# Patient Record
Sex: Male | Born: 1960 | Race: White | Hispanic: No | Marital: Married | Smoking: Current every day smoker
Health system: Southern US, Community
[De-identification: ages and names within clinical notes are randomized; demographics above are authoritative.]

## PROBLEM LIST (undated history)

## (undated) DIAGNOSIS — E785 Hyperlipidemia, unspecified: Secondary | ICD-10-CM

## (undated) DIAGNOSIS — C61 Malignant neoplasm of prostate: Secondary | ICD-10-CM

## (undated) DIAGNOSIS — I10 Essential (primary) hypertension: Secondary | ICD-10-CM

## (undated) HISTORY — PX: PROSTATE BIOPSY: SHX241

---

## 1999-04-07 ENCOUNTER — Encounter: Payer: Self-pay | Admitting: Neurosurgery

## 1999-04-09 ENCOUNTER — Encounter: Payer: Self-pay | Admitting: Neurosurgery

## 1999-04-09 ENCOUNTER — Inpatient Hospital Stay (HOSPITAL_COMMUNITY): Admission: RE | Admit: 1999-04-09 | Discharge: 1999-04-11 | Payer: Self-pay | Admitting: Neurosurgery

## 2002-09-13 ENCOUNTER — Other Ambulatory Visit: Admission: RE | Admit: 2002-09-13 | Discharge: 2002-09-13 | Payer: Self-pay | Admitting: Urology

## 2003-02-18 ENCOUNTER — Ambulatory Visit (HOSPITAL_COMMUNITY): Admission: RE | Admit: 2003-02-18 | Discharge: 2003-02-18 | Payer: Self-pay | Admitting: Pulmonary Disease

## 2003-03-18 ENCOUNTER — Encounter: Admission: RE | Admit: 2003-03-18 | Discharge: 2003-03-18 | Payer: Self-pay | Admitting: Neurosurgery

## 2003-03-18 ENCOUNTER — Encounter: Payer: Self-pay | Admitting: Neurosurgery

## 2003-03-18 ENCOUNTER — Encounter: Payer: Self-pay | Admitting: Radiology

## 2003-04-04 ENCOUNTER — Encounter: Admission: RE | Admit: 2003-04-04 | Discharge: 2003-04-04 | Payer: Self-pay | Admitting: Neurosurgery

## 2003-04-04 ENCOUNTER — Encounter: Payer: Self-pay | Admitting: Neurosurgery

## 2008-04-16 ENCOUNTER — Observation Stay (HOSPITAL_COMMUNITY): Admission: EM | Admit: 2008-04-16 | Discharge: 2008-04-16 | Payer: Self-pay | Admitting: Emergency Medicine

## 2008-04-17 ENCOUNTER — Ambulatory Visit: Payer: Self-pay | Admitting: Cardiology

## 2008-04-17 ENCOUNTER — Encounter (HOSPITAL_COMMUNITY): Admission: RE | Admit: 2008-04-17 | Discharge: 2008-05-17 | Payer: Self-pay | Admitting: Cardiology

## 2008-09-27 HISTORY — PX: BACK SURGERY: SHX140

## 2011-02-09 NOTE — H&P (Signed)
[name], [name]                ACCOUNT NO.:  [id number]   MEDICAL RECORD NO.:  [id number]          PATIENT TYPE:  OBV   LOCATION:  IC05                          FACILITY:  [facility]   PHYSICIAN:  Kingsley Callander. Ouida Sills, MD       DATE OF BIRTH:  1961/05/21   DATE OF ADMISSION:  04/15/2008  DATE OF DISCHARGE:  07/21/2009LH                              HISTORY & PHYSICAL   CHIEF COMPLAINT:  Chest pain.   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  He described diaphoresis and slight shortness of  breath when the pain was at its peak intensity.  The pain was an 8/10  for about 30 minutes.  He tried Tums and standing and walking about, but  was unable to achieve relief.  There was no syncope.  There was no  nausea or vomiting.  He presented to the emergency room, where his  initial EKG and cardiac markers were negative.  He was treated with  nitroglycerin twice, but his pain had begun to subside prior to it.  He  has risk factors of hypertension and a half-pack per day smoking.  He  does not have diabetes.  His last cholesterol was in the normal range.   PAST MEDICAL HISTORY:  1. Hypertension.  2. Lumbar disk surgery.   MEDICATIONS:  1. Lisinopril 20 mg daily.  2. Aspirin 81 mg daily.  3. AndroGel daily.  4. Multivitamin daily.  5. Fish oil daily.   ALLERGIES:  None.   SOCIAL HISTORY:  He does not abuse alcohol.   FAMILY HISTORY:  No history of heart disease.   REVIEW OF SYSTEMS:  Noncontributory.   PHYSICAL EXAMINATION:  Pulse 64, respirations 20, temperature 98.1,  blood pressure 114/71.  GENERAL:  Alert and in no distress at the time of my exam.  HEENT:  Eyes normal.  Pharynx unremarkable.  NECK:  Supple with no JVD or thyromegaly.  LUNGS:  Clear.  HEART:  Regular with no murmurs.  CHEST:  Nontender.  ABDOMEN:  Nontender.  No hepatosplenomegaly.  EXTREMITIES:   No cyanosis, clubbing or edema.  NEURO:  Grossly intact.   LABORATORY DATA:  His troponin I is less than 0.01.  His EKG reveals  normal sinus rhythm.  White count 10.4, hemoglobin 16.6, platelets  216,000.  The comprehensive metabolic panel is normal.  Glucose is 105.  His chest x-ray reveals no infiltrate.  His D-dimer is normal.   IMPRESSION:  Left chest pain and diaphoresis.  He will be hospitalized  for observation and repeat cardiac enzymes and electrocardiograms.  Will  continue aspirin.  At this point he certainly shows no sign of a  definite ischemic event.  1. Hypertension.      Kingsley Callander. Ouida Sills, MD  Electronically Signed     ROF/MEDQ  D:  04/16/2008  T:  04/16/2008  Job:  [id number]

## 2011-02-09 NOTE — Discharge Summary (Signed)
[name], [name]                ACCOUNT NO.:  [id number]   MEDICAL RECORD NO.:  [id number]          PATIENT TYPE:  OBV   LOCATION:  IC05                          FACILITY:  [facility]   PHYSICIAN:  Kingsley Callander. Ouida Sills, MD       DATE OF BIRTH:  10-23-1960   DATE OF ADMISSION:  04/15/2008  DATE OF DISCHARGE:  07/21/2009LH                               DISCHARGE SUMMARY   DISCHARGE DIAGNOSES:  1. Chest pain.  2. Hypertension.   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  His  initial EKG revealed no sign of ischemia.  Three sets of cardiac markers  were negative in the emergency room.  He was hospitalized in a monitored  setting overnight.  He had no recurrent pain.  He remained in sinus  rhythm.  His repeat EKG was normal.  His repeat cardiac enzymes were  normal.  His D-dimer was normal.  His chest x-ray revealed no  infiltrate.  He was felt to be stable for discharge with followup stress  testing, tentatively scheduled for tomorrow.   DISCHARGE MEDICATIONS:  1. Lisinopril 20 mg daily.  2. Aspirin 81 mg daily.  3. AndroGel daily.  4. Multivitamin daily.  5. Fish oil daily.      Kingsley Callander. Ouida Sills, MD  Electronically Signed     ROF/MEDQ  D:  04/16/2008  T:  04/16/2008  Job:  [id number]

## 2011-06-25 LAB — COMPREHENSIVE METABOLIC PANEL
AST: 27
Albumin: 4.4
Calcium: 9.9
Chloride: 103
Creatinine, Ser: 1.05
GFR calc Af Amer: >60
Total Bilirubin: 0.6
Total Protein: 7

## 2011-06-25 LAB — CBC
MCV: 94
Platelets: 216
RDW: 13
WBC: 10.4

## 2011-06-25 LAB — POCT CARDIAC MARKERS
Myoglobin, poc: 79.3
Myoglobin, poc: 80.6
Operator id: 240821
Troponin i, poc: <0.05

## 2011-06-25 LAB — DIFFERENTIAL
Eosinophils Relative: 1
Lymphocytes Relative: 17
Lymphs Abs: 1.8
Monocytes Absolute: 0.8
Monocytes Relative: 8

## 2011-06-25 LAB — CARDIAC PANEL(CRET KIN+CKTOT+MB+TROPI)
CK, MB: 2.1
Total CK: 186
Troponin I: 0.01

## 2011-06-25 LAB — CK TOTAL AND CKMB (NOT AT ARMC)
CK, MB: 2.1
Relative Index: 1

## 2011-06-25 LAB — TROPONIN I: Troponin I: <0.01

## 2012-03-14 ENCOUNTER — Other Ambulatory Visit (HOSPITAL_COMMUNITY): Payer: Self-pay | Admitting: General Surgery

## 2012-03-14 ENCOUNTER — Ambulatory Visit (HOSPITAL_COMMUNITY)
Admission: RE | Admit: 2012-03-14 | Discharge: 2012-03-14 | Disposition: A | Discharge status: Home / Self Care | Payer: Self-pay | Source: Ambulatory Visit | Attending: General Surgery | Admitting: General Surgery

## 2012-03-14 DIAGNOSIS — I729 Aneurysm of unspecified site: Secondary | ICD-10-CM

## 2014-04-09 ENCOUNTER — Telehealth: Payer: Self-pay | Admitting: Endocrinology

## 2014-04-09 NOTE — Telephone Encounter (Signed)
Blood sugars have been going very high mid 200s in the AM, and 300s acS (6-7PM) for last 3-4 days.  No changes in diet, or illness, or no new medications.  Pt. Reports that Apidra vial shows no signes of going bad--no clouding, or has not been exposed to extreme temperatures.  He has since opened a new bottle of Lantus 2 days ago. He has increased his Lantus from 7AM/15u HS to 9AM and 17PM, and is adding 2-3 extra units of insulin to his meals.  He was using a correction of 1u/50 points, and now is doing 1u/ "30 or 40 points".  I/C ratio is 1u/5acb, 1u/8acL, and 1u/12acS.    Plan:   1. Increase Lantus to 10uAM, and 19uHS.  Increase meal insulin to B: 1u/5, L: 1u/6, and supper: 1u/10.  Increase Sensitivity to 30.   2.  Call Thursday if Blood sugars do no come down.

## 2014-04-09 NOTE — Telephone Encounter (Signed)
This is incorrect patient

## 2014-04-17 NOTE — Telephone Encounter (Signed)
This note was written on the wrong patient's chart.

## 2016-01-29 MED FILL — CIALIS 5 MG TABLET: 5 | 90 days supply | Qty: 90 | Fill #0

## 2016-01-29 MED FILL — LISINOPRIL 20 MG TABLET: 20 | 90 days supply | Qty: 90 | Fill #0

## 2016-01-30 MED FILL — ULTRAVATE 0.05% LOTION: 0.05 | 30 days supply | Qty: 60 | Fill #0

## 2016-05-05 MED FILL — CIALIS 5 MG TABLET: 5 | 90 days supply | Qty: 90 | Fill #1

## 2016-06-21 MED FILL — LISINOPRIL 20 MG TABLET: 20 | 90 days supply | Qty: 90 | Fill #1

## 2016-07-01 ENCOUNTER — Other Ambulatory Visit: Payer: Self-pay

## 2016-07-01 ENCOUNTER — Telehealth: Payer: Self-pay

## 2016-07-01 DIAGNOSIS — Z1211 Encounter for screening for malignant neoplasm of colon: Secondary | ICD-10-CM

## 2016-07-01 NOTE — Telephone Encounter (Signed)
Pt was aware when I triaged him that I will leave a sample of the prep and the instructions at the front for pick up.  I am leaving the Movie prep with instructions attached.

## 2016-07-01 NOTE — Telephone Encounter (Signed)
Okay to schedule with conscious sedation

## 2016-07-01 NOTE — Telephone Encounter (Signed)
Gastroenterology Pre-Procedure Review  Request Date: 07/01/2016 Requesting Physician: Dr. Gala Romney  PATIENT REVIEW QUESTIONS: The patient responded to the following health history questions as indicated:    1. Diabetes Melitis: no 2. Joint replacements in the past 12 months: no 3. Major health problems in the past 3 months: no 4. Has an artificial valve or MVP: no 5. Has a defibrillator: no 6. Has been advised in past to take antibiotics in advance of a procedure like teeth cleaning: no 7. Family history of colon cancer: no  8. Alcohol Use: Red wine socially 9. History of sleep apnea: no     MEDICATIONS & ALLERGIES:    Patient reports the following regarding taking any blood thinners:   Plavix? no Aspirin? YES Coumadin? no  Patient confirms/reports the following medications:  Current Outpatient Prescriptions  Medication Sig Dispense Refill  . aspirin EC 81 MG tablet Take 81 mg by mouth daily.    Marland Kitchen lisinopril (PRINIVIL,ZESTRIL) 20 MG tablet Take 20 mg by mouth daily.     No current facility-administered medications for this visit.     Patient confirms/reports the following allergies:  No Known Allergies  No orders of the defined types were placed in this encounter.   AUTHORIZATION INFORMATION Primary Insurance:   ID #:  Group #:  Pre-Cert / Auth required Pre-Cert / Auth #:   Secondary Insurance:  ID #: Group #:  Pre-Cert / Auth required: Pre-Cert / Auth #:   SCHEDULE INFORMATION: Procedure has been scheduled as follows:  Date: 07/23/2016             Time: 10:00 AM  Location: [facility] Short Stay  This Gastroenterology Pre-Precedure Review Form is being routed to the following provider(s): R. Garfield Cornea, MD

## 2016-07-16 MED FILL — ULTRAVATE 0.05% LOTION: 0.05 | 30 days supply | Qty: 60 | Fill #1

## 2016-07-20 ENCOUNTER — Telehealth: Payer: Self-pay

## 2016-07-20 NOTE — Telephone Encounter (Signed)
I called [facility] and spoke to Ty T who said that a PA is not required for a screening colonoscopy as out patient.  Reference # [id number].

## 2016-07-23 ENCOUNTER — Ambulatory Visit (HOSPITAL_COMMUNITY)
Admission: RE | Admit: 2016-07-23 | Discharge: 2016-07-23 | Disposition: A | Discharge status: Home / Self Care | Payer: 59 | Source: Ambulatory Visit | Attending: Internal Medicine | Admitting: Internal Medicine

## 2016-07-23 ENCOUNTER — Encounter (HOSPITAL_COMMUNITY): Payer: Self-pay

## 2016-07-23 ENCOUNTER — Encounter (HOSPITAL_COMMUNITY)
Admission: RE | Disposition: A | Discharge status: Home / Self Care | Payer: Self-pay | Source: Ambulatory Visit | Attending: Internal Medicine

## 2016-07-23 DIAGNOSIS — Z1212 Encounter for screening for malignant neoplasm of rectum: Secondary | ICD-10-CM | POA: Diagnosis not present

## 2016-07-23 DIAGNOSIS — Z7982 Long term (current) use of aspirin: Secondary | ICD-10-CM | POA: Diagnosis not present

## 2016-07-23 DIAGNOSIS — I10 Essential (primary) hypertension: Secondary | ICD-10-CM | POA: Diagnosis not present

## 2016-07-23 DIAGNOSIS — N529 Male erectile dysfunction, unspecified: Secondary | ICD-10-CM | POA: Insufficient documentation

## 2016-07-23 DIAGNOSIS — K5730 Diverticulosis of large intestine without perforation or abscess without bleeding: Secondary | ICD-10-CM | POA: Diagnosis not present

## 2016-07-23 DIAGNOSIS — Z1211 Encounter for screening for malignant neoplasm of colon: Secondary | ICD-10-CM | POA: Diagnosis not present

## 2016-07-23 HISTORY — PX: COLONOSCOPY: SHX5424

## 2016-07-23 HISTORY — DX: Essential (primary) hypertension: I10

## 2016-07-23 SURGERY — COLONOSCOPY
Anesthesia: Moderate Sedation

## 2016-07-23 MED ORDER — MIDAZOLAM HCL 5 MG/5ML IJ SOLN
INTRAMUSCULAR | Status: AC
  Filled 2016-07-23: qty 10

## 2016-07-23 MED ORDER — SODIUM CHLORIDE 0.9 % IV SOLN
INTRAVENOUS | Status: DC
  Administered 2016-07-23: 10:00:00 via INTRAVENOUS

## 2016-07-23 MED ORDER — ONDANSETRON HCL 4 MG/2ML IJ SOLN
INTRAMUSCULAR | Status: AC
  Filled 2016-07-23: qty 2

## 2016-07-23 MED ORDER — MIDAZOLAM HCL 5 MG/5ML IJ SOLN
INTRAMUSCULAR | Status: DC | PRN
  Administered 2016-07-23: 1 mg via INTRAVENOUS
  Administered 2016-07-23 (×2): 2 mg via INTRAVENOUS
  Administered 2016-07-23: 1 mg via INTRAVENOUS

## 2016-07-23 MED ORDER — ONDANSETRON HCL 4 MG/2ML IJ SOLN
INTRAMUSCULAR | Status: DC | PRN
  Administered 2016-07-23: 4 mg via INTRAVENOUS

## 2016-07-23 MED ORDER — MEPERIDINE HCL 100 MG/ML IJ SOLN
INTRAMUSCULAR | Status: AC
  Filled 2016-07-23: qty 2

## 2016-07-23 MED ORDER — MEPERIDINE HCL 100 MG/ML IJ SOLN
INTRAMUSCULAR | Status: DC | PRN
  Administered 2016-07-23: 50 mg via INTRAVENOUS
  Administered 2016-07-23: 25 mg via INTRAVENOUS
  Administered 2016-07-23: 50 mg via INTRAVENOUS
  Administered 2016-07-23: 25 mg via INTRAVENOUS

## 2016-07-23 MED ORDER — STERILE WATER FOR IRRIGATION IR SOLN
Status: DC | PRN
  Administered 2016-07-23: 10:00:00

## 2016-07-23 NOTE — H&P (Signed)
@  [id number]   Primary Care Physician:  Alonza Bogus, MD Primary Gastroenterologist:  Dr. Gala Romney  Pre-Procedure History & Physical: HPI:  [name] is a 55 y.o. male is here for a screening colonoscopy. No bowel symptoms. No family history of colon cancer. No prior colonoscopy.  Past Medical History:  Diagnosis Date  . Hypertension     Past Surgical History:  Procedure Laterality Date  . BACK SURGERY  2010    Prior to Admission medications   Medication Sig Start Date End Date Taking? Authorizing Provider  aspirin EC 81 MG tablet Take 81 mg by mouth daily.   Yes Historical Provider, MD  lisinopril (PRINIVIL,ZESTRIL) 20 MG tablet Take 20 mg by mouth daily.   Yes Historical Provider, MD  Multiple Vitamin (MULTIVITAMIN WITH MINERALS) TABS tablet Take 1 tablet by mouth daily.   Yes Historical Provider, MD  tadalafil (CIALIS) 5 MG tablet Take 5 mg by mouth daily as needed for erectile dysfunction.   Yes Historical Provider, MD    Allergies as of 07/01/2016  . (No Known Allergies)    History reviewed. No pertinent family history.  Social History   Social History  . Marital status: Married    Spouse name: N/A  . Number of children: N/A  . Years of education: N/A   Occupational History  . Not on file.   Social History Main Topics  . Smoking status: Never Smoker  . Smokeless tobacco: Never Used  . Alcohol use Yes  . Drug use: No  . Sexual activity: Not on file   Other Topics Concern  . Not on file   Social History Narrative  . No narrative on file    Review of Systems: See HPI, otherwise negative ROS  Physical Exam: BP 120/79   Pulse 68   Temp 97.6 F (36.4 C) (Oral)   Resp 15   Ht 6' (1.829 m)   Wt 175 lb (79.4 kg)   SpO2 98%   BMI 23.73 kg/m  General:   Alert,  Well-developed, well-nourished, pleasant and cooperative in NAD Head:  Normocephalic and atraumatic. Lungs:  Clear throughout to auscultation.   No wheezes, crackles, or rhonchi. No acute  distress. Heart:  Regular rate and rhythm; no murmurs, clicks, rubs,  or gallops. Abdomen:  Soft, nontender and nondistended. No masses, hepatosplenomegaly or hernias noted. Normal bowel sounds, without guarding, and without rebound.    Impression/Plan: [name] is now here to undergo a screening colonoscopy.  First ever aperture screening examination.  Risks, benefits, limitations, imponderables and alternatives regarding colonoscopy have been reviewed with the patient. Questions have been answered. All parties agreeable.     Notice:  This dictation was prepared with Dragon dictation along with smaller phrase technology. Any transcriptional errors that result from this process are unintentional and may not be corrected upon review.

## 2016-07-23 NOTE — Discharge Instructions (Addendum)
°Colonoscopy °Discharge Instructions ° °Read the instructions outlined below and refer to this sheet in the next few weeks. These discharge instructions provide you with general information on caring for yourself after you leave the hospital. Your doctor may also give you specific instructions. While your treatment has been planned according to the most current medical practices available, unavoidable complications occasionally occur. If you have any problems or questions after discharge, call Dr. Rourk at [phone]. °ACTIVITY °· You may resume your regular activity, but move at a slower pace for the next 24 hours.  °· Take frequent rest periods for the next 24 hours.  °· Walking will help get rid of the air and reduce the bloated feeling in your belly (abdomen).  °· No driving for 24 hours (because of the medicine (anesthesia) used during the test).   °· Do not sign any important legal documents or operate any machinery for 24 hours (because of the anesthesia used during the test).  °NUTRITION °· Drink plenty of fluids.  °· You may resume your normal diet as instructed by your doctor.  °· Begin with a light meal and progress to your normal diet. Heavy or fried foods are harder to digest and may make you feel sick to your stomach (nauseated).  °· Avoid alcoholic beverages for 24 hours or as instructed.  °MEDICATIONS °· You may resume your normal medications unless your doctor tells you otherwise.  °WHAT YOU CAN EXPECT TODAY °· Some feelings of bloating in the abdomen.  °· Passage of more gas than usual.  °· Spotting of blood in your stool or on the toilet paper.  °IF YOU HAD POLYPS REMOVED DURING THE COLONOSCOPY: °· No aspirin products for 7 days or as instructed.  °· No alcohol for 7 days or as instructed.  °· Eat a soft diet for the next 24 hours.  °FINDING OUT THE RESULTS OF YOUR TEST °Not all test results are available during your visit. If your test results are not back during the visit, make an appointment  with your caregiver to find out the results. Do not assume everything is normal if you have not heard from your caregiver or the medical facility. It is important for you to follow up on all of your test results.  °SEEK IMMEDIATE MEDICAL ATTENTION IF: °· You have more than a spotting of blood in your stool.  °· Your belly is swollen (abdominal distention).  °· You are nauseated or vomiting.  °· You have a temperature over 101.  °· You have abdominal pain or discomfort that is severe or gets worse throughout the day.  ° ° ° °Diverticulosis information provided ° °Further recommendations to follow pending review of pathology report ° ° °Diverticulosis °Diverticulosis is the condition that develops when small pouches (diverticula) form in the wall of your colon. Your colon, or large intestine, is where water is absorbed and stool is formed. The pouches form when the inside layer of your colon pushes through weak spots in the outer layers of your colon. °CAUSES  °No one knows exactly what causes diverticulosis. °RISK FACTORS °Being older than 50. Your risk for this condition increases with age. Diverticulosis is rare in people younger than 40 years. By age 80, almost everyone has it. °Eating a low-fiber diet. °Being frequently constipated. °Being overweight. °Not getting enough exercise. °Smoking. °Taking over-the-counter pain medicines, like aspirin and ibuprofen. °SYMPTOMS  °Most people with diverticulosis do not have symptoms. °DIAGNOSIS  °Because diverticulosis often has no symptoms, health care   providers often discover the condition during an exam for other colon problems. In many cases, a health care provider will diagnose diverticulosis while using a flexible scope to examine the colon (colonoscopy). °TREATMENT  °If you have never developed an infection related to diverticulosis, you may not need treatment. If you have had an infection before, treatment may include: °Eating more fruits, vegetables, and  grains. °Taking a fiber supplement. °Taking a live bacteria supplement (probiotic). °Taking medicine to relax your colon. °HOME CARE INSTRUCTIONS  °Drink at least 6-8 glasses of water each day to prevent constipation. °Try not to strain when you have a bowel movement. °Keep all follow-up appointments. °If you have had an infection before:  °Increase the fiber in your diet as directed by your health care provider or dietitian. °Take a dietary fiber supplement if your health care provider approves. °Only take medicines as directed by your health care provider. °SEEK MEDICAL CARE IF:  °You have abdominal pain. °You have bloating. °You have cramps. °You have not gone to the bathroom in 3 days. °SEEK IMMEDIATE MEDICAL CARE IF:  °Your pain gets worse. °Your bloating becomes very bad. °You have a fever or chills, and your symptoms suddenly get worse. °You begin vomiting. °You have bowel movements that are bloody or black. °MAKE SURE YOU: °Understand these instructions. °Will watch your condition. °Will get help right away if you are not doing well or get worse. °  °This information is not intended to replace advice given to you by your health care provider. Make sure you discuss any questions you have with your health care provider. °  °Document Released: 06/10/2004 Document Revised: 09/18/2013 Document Reviewed: 08/08/2013 °Elsevier Interactive Patient Education ©2016 [employer] ° °

## 2016-07-23 NOTE — Op Note (Signed)
[facility] Patient Name: [name] Procedure Date: 07/23/2016 9:42 AM MRN: [id number] Date of Birth: [birth year] Attending MD: Norvel Richards , MD CSN: [id number] Age: 55 Admit Type: Outpatient Procedure:                Colonoscopy with biopsy Indications:              Screening for colorectal malignant neoplasm Providers:                Norvel Richards, MD, Renda Rolls, RN, Isabella Stalling, Technician Referring MD:              Medicines:                Midazolam 6 mg IV, Meperidine 150 mg IV,                            Ondansetron 4 mg IV Complications:            No immediate complications. Estimated Blood Loss:     Estimated blood loss was minimal. Procedure:                Pre-Anesthesia Assessment:                           - Prior to the procedure, a History and Physical                            was performed, and patient medications and                            allergies were reviewed. The patient's tolerance of                            previous anesthesia was also reviewed. The risks                            and benefits of the procedure and the sedation                            options and risks were discussed with the patient.                            All questions were answered, and informed consent                            was obtained. Prior Anticoagulants: The patient has                            taken no previous anticoagulant or antiplatelet                            agents. ASA Grade Assessment: II - A patient with  mild systemic disease. After reviewing the risks                            and benefits, the patient was deemed in                            satisfactory condition to undergo the procedure.                           After obtaining informed consent, the colonoscope                            was passed under direct vision. Throughout the   procedure, the patient's blood pressure, pulse, and                            oxygen saturations were monitored continuously. The                            EC-[id number] scope was introduced through                            the anus and advanced to the the cecum, identified                            by appendiceal orifice and ileocecal valve. The                            entire colon was well visualized. The ileocecal                            valve, appendiceal orifice, and rectum were                            photographed. The quality of the bowel preparation                            was adequate. Scope In: 9:56:23 AM Scope Out: 10:25:03 AM Scope Withdrawal Time: 0 hours 22 minutes 52 seconds  Total Procedure Duration: 0 hours 28 minutes 40 seconds  Findings:      The perianal and digital rectal examinations were normal.      Scattered small-mouthed diverticula were found in the sigmoid colon and       descending colon. Couple of tiny 1-2 mm minimal mamillaions scattered in       the descending segment (one of these was biopsied). Otherwise, the       colonic mucosa appeared normal. Estimated blood loss was minimal. Impression:               - Diverticulosis in the sigmoid colon and in the                            descending colon.                           -  colon biopsy as descried above Moderate Sedation:      Moderate (conscious) sedation was administered by the endoscopy nurse       and supervised by the endoscopist. The following parameters were       monitored: oxygen saturation, heart rate, blood pressure, respiratory       rate, EKG, adequacy of pulmonary ventilation, and response to care.       Total physician intraservice time was 44 minutes.      Moderate (conscious) sedation was administered by the endoscopy nurse       and supervised by the endoscopist. The following parameters were       monitored: oxygen saturation, heart rate, blood pressure,  respiratory       rate, EKG, adequacy of pulmonary ventilation, and response to care.       Total physician intraservice time was 38 minutes. Recommendation:           - Patient has a contact number available for                            emergencies. The signs and symptoms of potential                            delayed complications were discussed with the                            patient. Return to normal activities tomorrow.                            Written discharge instructions were provided to the                            patient.                           - Resume previous diet.                           - Continue present medications.                           - Repeat colonoscopy in 10 years for screening                            purposes. Follow-up on pathology                           - Return to GI office PRN. Procedure Code(s):        --- Professional ---                           [phone], Colonoscopy, flexible; diagnostic, including                            collection of specimen(s) by brushing or washing,                            when performed (separate procedure)  [id number], Moderate sedation services provided by the                            same physician or other qualified health care                            professional performing the diagnostic or                            therapeutic service that the sedation supports,                            requiring the presence of an independent trained                            observer to assist in the monitoring of the                            patient's level of consciousness and physiological                            status; initial 15 minutes of intraservice time,                            patient age 35 years or older                           [id number], Moderate sedation services provided by the                            same physician or other qualified health care                             professional performing the diagnostic or                            therapeutic service that the sedation supports,                            requiring the presence of an independent trained                            observer to assist in the monitoring of the                            patient's level of consciousness and physiological                            status; initial 15 minutes of intraservice time,                            patient age 23 years or older  [id number], Moderate sedation services; each additional                            15 minutes intraservice time                           [id number], Moderate sedation services; each additional                            15 minutes intraservice time                           [id number], Moderate sedation services; each additional                            15 minutes intraservice time                           [id number], Moderate sedation services; each additional                            15 minutes intraservice time Diagnosis Code(s):        --- Professional ---                           Z12.11, Encounter for screening for malignant                            neoplasm of colon                           K57.30, Diverticulosis of large intestine without                            perforation or abscess without bleeding CPT copyright 2016 [employer]. All rights reserved. The codes documented in this report are preliminary and upon coder review may  be revised to meet current compliance requirements. Cristopher Estimable. Jeiry Birnbaum, MD Norvel Richards, MD 07/23/2016 10:40:40 AM This report has been signed electronically. Number of Addenda: 0

## 2016-07-26 ENCOUNTER — Encounter: Payer: Self-pay | Admitting: Internal Medicine

## 2016-07-27 ENCOUNTER — Encounter (HOSPITAL_COMMUNITY): Payer: Self-pay | Admitting: Internal Medicine

## 2016-08-05 DIAGNOSIS — H52223 Regular astigmatism, bilateral: Secondary | ICD-10-CM | POA: Diagnosis not present

## 2016-08-05 DIAGNOSIS — H524 Presbyopia: Secondary | ICD-10-CM | POA: Diagnosis not present

## 2016-08-05 DIAGNOSIS — H5201 Hypermetropia, right eye: Secondary | ICD-10-CM | POA: Diagnosis not present

## 2016-08-05 DIAGNOSIS — D3131 Benign neoplasm of right choroid: Secondary | ICD-10-CM | POA: Diagnosis not present

## 2016-08-12 DIAGNOSIS — Z0000 Encounter for general adult medical examination without abnormal findings: Secondary | ICD-10-CM | POA: Diagnosis not present

## 2016-08-12 MED FILL — LISINOPRIL-HCTZ 20-12.5 MG: 20-12.5 | 90 days supply | Qty: 90 | Fill #0

## 2016-10-07 MED FILL — CIALIS 5 MG TABLET: 5 | 30 days supply | Qty: 30 | Fill #2

## 2016-11-08 MED FILL — LISINOPRIL-HCTZ 20-12.5 MG: 20-12.5 | 90 days supply | Qty: 90 | Fill #1

## 2017-02-02 MED FILL — LISINOPRIL-HCTZ 20-12.5 MG: 20-12.5 | 90 days supply | Qty: 90 | Fill #2

## 2017-03-22 MED FILL — AMOXICILLIN 500 MG CAPSULE: 500 | 5 days supply | Qty: 15 | Fill #0

## 2017-03-29 MED FILL — AMOXICILLIN 500 MG CAPSULE: 500 | 5 days supply | Qty: 15 | Fill #0

## 2017-05-09 MED FILL — LISINOPRIL-HCTZ 20-12.5 MG: 20-12.5 | 90 days supply | Qty: 90 | Fill #3

## 2017-07-13 ENCOUNTER — Encounter (HOSPITAL_COMMUNITY): Payer: Self-pay | Admitting: Emergency Medicine

## 2017-07-13 ENCOUNTER — Emergency Department (HOSPITAL_COMMUNITY)
Admission: EM | Admit: 2017-07-13 | Discharge: 2017-07-13 | Disposition: A | Discharge status: Home / Self Care | Payer: PRIVATE HEALTH INSURANCE | Attending: Emergency Medicine | Admitting: Emergency Medicine

## 2017-07-13 DIAGNOSIS — Z79899 Other long term (current) drug therapy: Secondary | ICD-10-CM | POA: Insufficient documentation

## 2017-07-13 DIAGNOSIS — Z7982 Long term (current) use of aspirin: Secondary | ICD-10-CM | POA: Diagnosis not present

## 2017-07-13 DIAGNOSIS — I10 Essential (primary) hypertension: Secondary | ICD-10-CM | POA: Diagnosis not present

## 2017-07-13 DIAGNOSIS — T592X1A Toxic effect of formaldehyde, accidental (unintentional), initial encounter: Secondary | ICD-10-CM | POA: Diagnosis not present

## 2017-07-13 DIAGNOSIS — Z77098 Contact with and (suspected) exposure to other hazardous, chiefly nonmedicinal, chemicals: Secondary | ICD-10-CM

## 2017-07-13 NOTE — ED Triage Notes (Signed)
Pt was exposed to 452ml formaldehyde spill. No initial symptoms, no symptoms after decon shower. nad noted.

## 2017-07-13 NOTE — ED Provider Notes (Signed)
[facility] EMERGENCY DEPARTMENT Provider Note   CSN: [id number] Arrival date & time: 07/13/17  1055     History   Chief Complaint No chief complaint on file.   HPI [name] is a 56 y.o. male.  HPI   56 year old male presenting for evaluation of exposure to formaldehyde fume.  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  During the surgery, a large formaldehyde bottle with specimen fell to the ground and approximately 49ml of liquid were exposed to the air.  It was approximately 30 minutes of inhalation chemical exposure before the OR patient was safely extubated and the staff can leave the room.  Pt initially without symptoms. No hx of lung disease.    Past Medical History:  Diagnosis Date  . Hypertension     There are no active problems to display for this patient.   Past Surgical History:  Procedure Laterality Date  . BACK SURGERY  2010  . COLONOSCOPY N/A 07/23/2016   Procedure: COLONOSCOPY;  Surgeon: Daneil Dolin, MD;  [facility];  Service: Endoscopy;  Laterality: N/A;  10:00 AM       Home Medications    Prior to Admission medications   Medication Sig Start Date End Date Taking? Authorizing Provider  aspirin EC 81 MG tablet Take 81 mg by mouth daily.    [provider]  lisinopril (PRINIVIL,ZESTRIL) 20 MG tablet Take 20 mg by mouth daily.    [provider]  Multiple Vitamin (MULTIVITAMIN WITH MINERALS) TABS tablet Take 1 tablet by mouth daily.    [provider]  tadalafil (CIALIS) 5 MG tablet Take 5 mg by mouth daily as needed for erectile dysfunction.    [provider]    Family History No family history on file.  Social History Social History  Substance Use Topics  . Smoking status: Never Smoker  . Smokeless tobacco: Never Used  . Alcohol use Yes     Allergies   Patient has no known allergies.   Review of Systems Review of Systems  All other  systems reviewed and are negative.    Physical Exam Updated Vital Signs There were no vitals taken for this visit.  Physical Exam  Constitutional: He is oriented to person, place, and time. He appears well-developed and well-nourished. No distress.  HENT:  Head: Atraumatic.  Nose: Nose normal.  Mouth/Throat: Oropharynx is clear and moist.  Eyes: Conjunctivae are normal.  Neck: Neck supple.  Cardiovascular: Normal rate and regular rhythm.   Pulmonary/Chest: Effort normal and breath sounds normal. No respiratory distress. He has no wheezes.  Abdominal: Soft.  Neurological: He is alert and oriented to person, place, and time.  Skin: No rash noted.  Psychiatric: He has a normal mood and affect.  Nursing note and vitals reviewed.    ED Treatments / Results  Labs (all labs ordered are listed, but only abnormal results are displayed) Labs Reviewed - No data to display  EKG  EKG Interpretation None       Radiology No results found.  Procedures Procedures (including critical care time)  Medications Ordered in ED Medications - No data to display   Initial Impression / Assessment and Plan / ED Course  I have reviewed the triage vital signs and the nursing notes.  Pertinent labs & imaging results that were available during my care of the patient were reviewed by me and considered in my  medical decision making (see chart for details).     Pulse 96   Temp 97.9 F (36.6 C) (Oral)   Resp 18   SpO2 100%    Final Clinical Impressions(s) / ED Diagnoses   Final diagnoses:  Exposure to chemical inhalation    New Prescriptions New Prescriptions   No medications on file   11:47 AM Pt here for inhalation exposure to formaldehyde while working in the OR today.  Approximately 30 minutes of exposure time. Supportive measures including removing of contaminated clothing, and the skin is thoroughly washed.  An [place] have been set up to care for the staff  with exposures.    Pt is currently sxs free.  He has been monitored for the past 1-2 hrs.  Felt stable for discharge.  Return precaution given.    Domenic Moras, PA-C 07/13/17 1209    Pattricia Boss, MD 07/13/17 [phone]

## 2017-08-02 MED FILL — LISINOPRIL-HCTZ 20-12.5 MG: 20-12.5 | 90 days supply | Qty: 90 | Fill #0

## 2017-10-31 MED FILL — LISINOPRIL-HCTZ 20-12.5 MG: 20-12.5 | 90 days supply | Qty: 90 | Fill #1

## 2018-01-30 MED FILL — LISINOPRIL-HCTZ 20-12.5 MG: 20-12.5 | 90 days supply | Qty: 90 | Fill #2

## 2018-02-28 DIAGNOSIS — Z0000 Encounter for general adult medical examination without abnormal findings: Secondary | ICD-10-CM | POA: Diagnosis not present

## 2018-02-28 MED FILL — HALOBETASOL PROP 0.05% CRM: 0.05 | 30 days supply | Qty: 50 | Fill #0

## 2018-02-28 MED FILL — ROSUVASTATIN CALCIUM 10 MG: 10 | 90 days supply | Qty: 90 | Fill #0

## 2018-04-24 MED FILL — LISINOPRIL-HCTZ 20-12.5 MG: 20-12.5 | 90 days supply | Qty: 90 | Fill #3

## 2018-05-22 MED FILL — ROSUVASTATIN CALCIUM 10 MG: 10 | 90 days supply | Qty: 90 | Fill #1

## 2018-07-27 DIAGNOSIS — L570 Actinic keratosis: Secondary | ICD-10-CM | POA: Diagnosis not present

## 2018-07-27 DIAGNOSIS — D225 Melanocytic nevi of trunk: Secondary | ICD-10-CM | POA: Diagnosis not present

## 2018-07-27 DIAGNOSIS — Z1283 Encounter for screening for malignant neoplasm of skin: Secondary | ICD-10-CM | POA: Diagnosis not present

## 2018-07-27 DIAGNOSIS — X32XXXA Exposure to sunlight, initial encounter: Secondary | ICD-10-CM | POA: Diagnosis not present

## 2018-07-27 DIAGNOSIS — L308 Other specified dermatitis: Secondary | ICD-10-CM | POA: Diagnosis not present

## 2018-07-27 DIAGNOSIS — B360 Pityriasis versicolor: Secondary | ICD-10-CM | POA: Diagnosis not present

## 2018-07-27 MED FILL — KETOCONAZOLE 2% CREAM: 2 | 20 days supply | Qty: 60 | Fill #0

## 2018-07-31 MED FILL — LISINOPRIL-HCTZ 20-12.5 MG: 20-12.5 | 90 days supply | Qty: 90 | Fill #0

## 2018-08-21 MED FILL — ROSUVASTATIN CALCIUM 10 MG: 10 | 90 days supply | Qty: 90 | Fill #2

## 2018-09-11 DIAGNOSIS — E785 Hyperlipidemia, unspecified: Secondary | ICD-10-CM | POA: Diagnosis not present

## 2018-09-11 DIAGNOSIS — I10 Essential (primary) hypertension: Secondary | ICD-10-CM | POA: Diagnosis not present

## 2018-10-30 MED FILL — LISINOPRIL-HCTZ 20-12.5 MG: 20-12.5 | 90 days supply | Qty: 90 | Fill #1 | Status: TO

## 2018-11-20 MED FILL — ROSUVASTATIN CALCIUM 10 MG: 10 | 90 days supply | Qty: 90 | Fill #3

## 2019-01-22 MED FILL — LISINOPRIL-HCTZ 20-12.5 MG: 20-12.5 | 90 days supply | Qty: 90 | Fill #0

## 2019-02-26 MED FILL — ROSUVASTATIN CALCIUM 10 MG: 10 | 90 days supply | Qty: 90 | Fill #0

## 2019-04-17 MED FILL — LISINOPRIL-HCTZ 20-12.5 MG: 20-12.5 | 90 days supply | Qty: 90 | Fill #0

## 2019-05-18 MED FILL — ROSUVASTATIN CALCIUM 10 MG: 10 | 90 days supply | Qty: 90 | Fill #0

## 2019-07-20 MED FILL — LISINOPRIL-HCTZ 20-12.5 MG: 20-12.5 | 90 days supply | Qty: 90 | Fill #0

## 2019-08-13 MED FILL — ROSUVASTATIN CALCIUM 10 MG: 10 | 90 days supply | Qty: 90 | Fill #1

## 2019-10-22 MED FILL — LISINOPRIL-HCTZ 20-12.5 MG: 20-12.5 | 90 days supply | Qty: 90 | Fill #1

## 2019-11-19 MED FILL — ROSUVASTATIN CALCIUM 10 MG: 10 | 90 days supply | Qty: 90 | Fill #2

## 2019-12-12 DIAGNOSIS — R7309 Other abnormal glucose: Secondary | ICD-10-CM | POA: Diagnosis not present

## 2019-12-12 DIAGNOSIS — E7849 Other hyperlipidemia: Secondary | ICD-10-CM | POA: Diagnosis not present

## 2020-01-21 ENCOUNTER — Other Ambulatory Visit (HOSPITAL_COMMUNITY): Payer: Self-pay | Admitting: Family Medicine

## 2020-01-21 MED FILL — OLMESARTAN MEDOXOMIL 20 MG: 20 | 90 days supply | Qty: 90 | Fill #0

## 2020-02-11 ENCOUNTER — Other Ambulatory Visit (HOSPITAL_COMMUNITY): Payer: Self-pay | Admitting: Family Medicine

## 2020-02-11 MED FILL — ROSUVASTATIN CALCIUM 10 MG: 10 | 90 days supply | Qty: 90 | Fill #0

## 2020-03-13 DIAGNOSIS — Z23 Encounter for immunization: Secondary | ICD-10-CM | POA: Diagnosis not present

## 2020-03-13 DIAGNOSIS — Z1389 Encounter for screening for other disorder: Secondary | ICD-10-CM | POA: Diagnosis not present

## 2020-03-13 DIAGNOSIS — E7849 Other hyperlipidemia: Secondary | ICD-10-CM | POA: Diagnosis not present

## 2020-03-13 DIAGNOSIS — Z0001 Encounter for general adult medical examination with abnormal findings: Secondary | ICD-10-CM | POA: Diagnosis not present

## 2020-03-13 DIAGNOSIS — Z6826 Body mass index (BMI) 26.0-26.9, adult: Secondary | ICD-10-CM | POA: Diagnosis not present

## 2020-03-13 DIAGNOSIS — I10 Essential (primary) hypertension: Secondary | ICD-10-CM | POA: Diagnosis not present

## 2020-03-13 DIAGNOSIS — E663 Overweight: Secondary | ICD-10-CM | POA: Diagnosis not present

## 2020-03-17 DIAGNOSIS — Z1389 Encounter for screening for other disorder: Secondary | ICD-10-CM | POA: Diagnosis not present

## 2020-03-17 DIAGNOSIS — I10 Essential (primary) hypertension: Secondary | ICD-10-CM | POA: Diagnosis not present

## 2020-03-17 DIAGNOSIS — E7849 Other hyperlipidemia: Secondary | ICD-10-CM | POA: Diagnosis not present

## 2020-03-17 DIAGNOSIS — Z0001 Encounter for general adult medical examination with abnormal findings: Secondary | ICD-10-CM | POA: Diagnosis not present

## 2020-04-03 DIAGNOSIS — R972 Elevated prostate specific antigen [PSA]: Secondary | ICD-10-CM | POA: Diagnosis not present

## 2020-04-15 MED FILL — OLMESARTAN MEDOXOMIL 20 MG: 20 | 90 days supply | Qty: 90 | Fill #1

## 2020-04-22 DIAGNOSIS — H524 Presbyopia: Secondary | ICD-10-CM | POA: Diagnosis not present

## 2020-04-22 DIAGNOSIS — H5203 Hypermetropia, bilateral: Secondary | ICD-10-CM | POA: Diagnosis not present

## 2020-04-22 DIAGNOSIS — H52223 Regular astigmatism, bilateral: Secondary | ICD-10-CM | POA: Diagnosis not present

## 2020-05-12 MED FILL — ROSUVASTATIN CALCIUM 10 MG: 10 | 90 days supply | Qty: 90 | Fill #1

## 2020-05-15 DIAGNOSIS — Z23 Encounter for immunization: Secondary | ICD-10-CM | POA: Diagnosis not present

## 2020-06-17 ENCOUNTER — Ambulatory Visit (INDEPENDENT_AMBULATORY_CARE_PROVIDER_SITE_OTHER): Payer: 59 | Admitting: Urology

## 2020-06-17 ENCOUNTER — Encounter: Payer: Self-pay | Admitting: Urology

## 2020-06-17 ENCOUNTER — Other Ambulatory Visit: Payer: Self-pay

## 2020-06-17 VITALS — BP 127/77 | HR 91 | Temp 97.8°F | Ht 72.0 in | Wt 175.0 lb

## 2020-06-17 DIAGNOSIS — R972 Elevated prostate specific antigen [PSA]: Secondary | ICD-10-CM

## 2020-06-17 LAB — MICROSCOPIC EXAMINATION
Bacteria, UA: NONE SEEN
Epithelial Cells (non renal): NONE SEEN /hpf (ref 0–10)
Renal Epithel, UA: NONE SEEN /hpf
WBC, UA: NONE SEEN /hpf (ref 0–5)

## 2020-06-17 LAB — URINALYSIS, ROUTINE W REFLEX MICROSCOPIC
Bilirubin, UA: NEGATIVE
Glucose, UA: NEGATIVE
Ketones, UA: NEGATIVE
Leukocytes,UA: NEGATIVE
Nitrite, UA: NEGATIVE
Protein,UA: NEGATIVE
Specific Gravity, UA: 1.025 (ref 1.005–1.030)
Urobilinogen, Ur: 0.2 mg/dL (ref 0.2–1.0)
pH, UA: 7 (ref 5.0–7.5)

## 2020-06-17 MED ORDER — LEVOFLOXACIN 750 MG PO TABS: 750.0000 mg | ORAL_TABLET | Freq: Every day | ORAL | 0 refills | Status: DC

## 2020-06-17 MED FILL — levoFLOXacin 750 MG TABS: 750 | 1 days supply | Qty: 1 | Fill #0

## 2020-06-17 NOTE — Patient Instructions (Addendum)
Appointment Time: 12:15 Appointment Date: 07/01/2020  Location: [facility] Rad[facility] Department   Prostate Biopsy Instructions  Stop all aspirin or blood thinners (aspirin, plavix, coumadin, warfarin, motrin, ibuprofen, advil, aleve, naproxen, naprosyn) for 7 days prior to the procedure.  If you have any questions about stopping these medications, please contact your primary care physician or cardiologist.  Having a light meal prior to the procedure is recommended.  If you are diabetic or have low blood sugar please bring a small snack or glucose tablet.  A Fleets enema is needed to be purchased over the counter at a local pharmacy and used 2 hours before you scheduled appointment.  This can be purchased over the counter at any pharmacy.  Antibiotics will be administered in the clinic at the time of the procedure and 1 tablet has been sent to your pharmacy. Please take the antibiotic as prescribed.    Please bring someone with you to the procedure to drive you home if you are given a valium to take prior to your procedure.   If you have any questions or concerns, please feel free to call the office at [phone] or send a Mychart message.    Thank you, [facility]'   Transrectal Ultrasound-Guided Prostate Biopsy, Care After       This sheet gives you information about how to care for yourself after your procedure. Your health care provider may also give you more specific instructions. If you have problems or questions, contact your health care provider. What can I expect after the procedure? After the procedure, it is common to have:  Pain and discomfort near your rectum, especially while sitting.  Pink-colored urine due to small amounts of blood in your urine.  A burning feeling while urinating.  Blood in your stool (feces) or bleeding from your rectum.  Blood in your semen. Follow these instructions at home: Medicines  Take over-the-counter and  prescription medicines only as told by your health care provider.  If you were prescribed antibiotic medicine, take it as told by your health care provider. Do not stop taking the antibiotic even if you start to feel better. Activity  Do not drive for 24 hours if you were given a medicine to help you relax (sedative) during your procedure.  Return to your normal activities as told by your health care provider. Ask your health care provider what activities are safe for you.  Ask your health care provider when it is okay for you to resume sexual activity.  Do not lift anything that is heavier than 10 lb (4.5 kg), or the limit that you are told, until your health care provider says that it is safe. General instructions   Drink enough water to keep your urine pale yellow.  Watch your urine, stool, and semen for new or increased bleeding.  Keep all follow-up visits as told by your health care provider. This is important. Contact a health care provider if:  You have any of the following: ? Blood clots in your urine or stool. ? Blood in your urine more than 2 weeks after the procedure. ? Blood in your semen more than 2 months after the procedure. ? New or increased bleeding in your urine, stool, or semen. ? Severe pain in your abdomen.  Your urine smells bad or unusual.  You have trouble urinating.  Your lower abdomen feels firm.  You have problems getting an erection.  You have nausea or you vomit. Get help right  away if:  You have a fever or chills. This could be a sign of infection.  You have bright red urine.  You have severe pain that does not get better with medicine.  You cannot urinate. Summary  After this procedure, it is common to have pain and discomfort around your rectum, especially while sitting.  You may have blood in your urine and stool after the procedure.  It is common to have blood in your semen for 1-2 months after this procedure.  If you were  prescribed antibiotic medicine, take it as told by your health care provider. Do not stop taking the antibiotic even if you start to feel better.  Get help right away if you have a fever or chills. This could be a sign of infection. This information is not intended to replace advice given to you by your health care provider. Make sure you discuss any questions you have with your health care provider. Document Revised: 01/03/2019 Document Reviewed: 03/07/2017 Elsevier Patient Education  [place].

## 2020-06-17 NOTE — Progress Notes (Signed)

## 2020-06-17 NOTE — Progress Notes (Signed)
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 He denies any significant LUTS. PSA was 4.8 this year and 4.7 last year. UA showed trace blood but microscopy showed 0-2 RBCs/hpf. No dysuria. No UTIs and prostate infection. No family hx of prostate cancer. He has ED and takes 5mg  cialis as needed.     PMH: Past Medical History:  Diagnosis Date  . Hypertension     Surgical History: Past Surgical History:  Procedure Laterality Date  . BACK SURGERY  2010  . COLONOSCOPY N/A 07/23/2016   Procedure: COLONOSCOPY;  Surgeon: Daneil Dolin, MD;  [facility];  Service: Endoscopy;  Laterality: N/A;  10:00 AM    Home Medications:  Allergies as of 06/17/2020   No Known Allergies     Medication List       Accurate as of June 17, 2020  3:25 PM. If you have any questions, ask your nurse or doctor.        STOP taking these medications   lisinopril 20 MG tablet Commonly known as: ZESTRIL Stopped by: Nicolette Bang, MD     TAKE these medications   aspirin EC 81 MG tablet Take 81 mg by mouth daily.   multivitamin with minerals Tabs tablet Take 1 tablet by mouth daily.   olmesartan 20 MG tablet Commonly known as: BENICAR Take 20 mg by mouth daily.   rosuvastatin 10 MG tablet Commonly known as: CRESTOR Take 10 mg by mouth daily.   tadalafil 5 MG tablet Commonly known as: CIALIS Take 5 mg by mouth daily as needed for erectile dysfunction.       Allergies: No Known Allergies  Family History: History reviewed. No pertinent family history.  Social History:  reports that he has never smoked. He has never used smokeless tobacco. He reports current alcohol use. He reports that he does not use drugs.  ROS: All other review of systems were reviewed and are negative except what is noted above in  HPI  Physical Exam: BP 127/77   Pulse 91   Temp 97.8 F (36.6 C)   Ht 6' (1.829 m)   Wt 175 lb (79.4 kg)   BMI 23.73 kg/m   Constitutional:  Alert and oriented, No acute distress. HEENT: [place] AT, moist mucus membranes.  Trachea midline, no masses. Cardiovascular: No clubbing, cyanosis, or edema. Respiratory: Normal respiratory effort, no increased work of breathing. GI: Abdomen is soft, nontender, nondistended, no abdominal masses GU: No CVA tenderness. Circumcised phallus. No masses/lesions on penis, testis, scrotum. Prostate 50g smooth no nodules no induration.  Lymph: No cervical or inguinal lymphadenopathy. Skin: No rashes, bruises or suspicious lesions. Neurologic: Grossly intact, no focal deficits, moving all 4 extremities. Psychiatric: Normal mood and affect.  Laboratory Data: Lab Results  Component Value Date   WBC 10.4 04/16/2008   HGB 16.6 04/16/2008   HCT 48.9 04/16/2008   MCV 94.0 04/16/2008   PLT 216 04/16/2008    Lab Results  Component Value Date   CREATININE 1.05 04/16/2008    No results found for: PSA  No results found for: TESTOSTERONE  No results found for: HGBA1C  Urinalysis No results found for: COLORURINE, APPEARANCEUR, LABSPEC, PHURINE, GLUCOSEU, HGBUR, BILIRUBINUR, KETONESUR, PROTEINUR, UROBILINOGEN, NITRITE, LEUKOCYTESUR  No results found for: LABMICR, [place], RBCUA, LABEPIT, MUCUS, BACTERIA  Pertinent Imaging:  No results  found for this or any previous visit.  No results found for this or any previous visit.  No results found for this or any previous visit.  No results found for this or any previous visit.  No results found for this or any previous visit.  No results found for this or any previous visit.  No results found for this or any previous visit.  No results found for this or any previous visit.   Assessment & Plan:    1. Elevated PSA The patient and I talked about etiologies of elevated PSA.  We discussed the possible  relationship between elevated PSA, prostate cancer, BPH, prostatitis, and UTI.   Conservative treatment of elevated PSA with watchful waiting was discussed with the patient.  All questions were answered.        All of the risks and benefits along with alternatives to prostate biopsy were discussed with the patient.  The patient gave fully informed consent to proceed with a transrectal ultrasound guided biopsy of the prostate for the evaluation of their evated PSA.  Prostate biopsy instructions and antibiotics were given to the patient.       - Urinalysis, Routine w reflex microscopic   No follow-ups on file.  Nicolette Bang, MD  [facility] [place]

## 2020-06-18 ENCOUNTER — Other Ambulatory Visit (HOSPITAL_COMMUNITY): Payer: Self-pay | Admitting: Urology

## 2020-06-18 DIAGNOSIS — R972 Elevated prostate specific antigen [PSA]: Secondary | ICD-10-CM

## 2020-07-01 ENCOUNTER — Ambulatory Visit (HOSPITAL_COMMUNITY)
Admission: RE | Admit: 2020-07-01 | Discharge: 2020-07-01 | Disposition: A | Discharge status: Home / Self Care | Payer: 59 | Source: Ambulatory Visit | Attending: Urology | Admitting: Urology

## 2020-07-01 ENCOUNTER — Encounter (HOSPITAL_COMMUNITY): Payer: Self-pay

## 2020-07-01 ENCOUNTER — Other Ambulatory Visit: Payer: Self-pay | Admitting: Urology

## 2020-07-01 ENCOUNTER — Ambulatory Visit (INDEPENDENT_AMBULATORY_CARE_PROVIDER_SITE_OTHER): Payer: 59 | Admitting: Urology

## 2020-07-01 ENCOUNTER — Other Ambulatory Visit: Payer: Self-pay

## 2020-07-01 DIAGNOSIS — R972 Elevated prostate specific antigen [PSA]: Secondary | ICD-10-CM | POA: Diagnosis not present

## 2020-07-01 DIAGNOSIS — C61 Malignant neoplasm of prostate: Secondary | ICD-10-CM | POA: Diagnosis not present

## 2020-07-01 MED ORDER — LIDOCAINE HCL (PF) 2 % IJ SOLN
10.0000 mL | Freq: Once | INTRAMUSCULAR | Status: AC
  Administered 2020-07-01: 10 mL via INTRADERMAL

## 2020-07-01 MED ORDER — GENTAMICIN SULFATE 40 MG/ML IJ SOLN
INTRAMUSCULAR | Status: AC
  Administered 2020-07-01: 80 mg
  Filled 2020-07-01: qty 2

## 2020-07-01 NOTE — Sedation Documentation (Signed)
Consent for procedure completed prior to biopsy. PT tolerated prostate biopsy procedure well today and verbalized understanding of discharge instructions. PT ambulatory at discharge with vitals signs WNL and no acute distress noted. PT to follow up with urologist staff if any complications arise and will follow up with urologist office at next scheduled visit.

## 2020-07-02 ENCOUNTER — Encounter: Payer: Self-pay | Admitting: Urology

## 2020-07-02 NOTE — Patient Instructions (Signed)

## 2020-07-02 NOTE — Progress Notes (Signed)
Prostate Biopsy Procedure   Informed consent was obtained after discussing risks/benefits of the procedure.  A time out was performed to ensure correct patient identity.  Pre-Procedure: - Last PSA Level: No results found for: PSA - Gentamicin given prophylactically - Levaquin 500 mg administered PO -Transrectal Ultrasound performed revealing a 33.2 gm prostate -No significant hypoechoic or median lobe noted  Procedure: - Prostate block performed using 10 cc 1% lidocaine and biopsies taken from sextant areas, a total of 12 under ultrasound guidance.  Post-Procedure: - Patient tolerated the procedure well - He was counseled to seek immediate medical attention if experiences any severe pain, significant bleeding, or fevers - Return in one week to discuss biopsy results

## 2020-07-04 ENCOUNTER — Other Ambulatory Visit: Payer: Self-pay | Admitting: Urology

## 2020-07-04 DIAGNOSIS — C61 Malignant neoplasm of prostate: Secondary | ICD-10-CM

## 2020-07-04 NOTE — Progress Notes (Signed)
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

## 2020-07-14 MED FILL — OLMESARTAN MEDOXOMIL 20 MG: 20 | 90 days supply | Qty: 90 | Fill #2

## 2020-07-17 DIAGNOSIS — C61 Malignant neoplasm of prostate: Secondary | ICD-10-CM | POA: Diagnosis not present

## 2020-07-21 ENCOUNTER — Other Ambulatory Visit: Payer: Self-pay | Admitting: Urology

## 2020-07-28 ENCOUNTER — Other Ambulatory Visit: Payer: Self-pay | Admitting: Urology

## 2020-07-28 ENCOUNTER — Other Ambulatory Visit (HOSPITAL_COMMUNITY): Payer: Self-pay | Admitting: Urology

## 2020-07-28 DIAGNOSIS — C61 Malignant neoplasm of prostate: Secondary | ICD-10-CM

## 2020-07-31 DIAGNOSIS — M6289 Other specified disorders of muscle: Secondary | ICD-10-CM | POA: Diagnosis not present

## 2020-07-31 DIAGNOSIS — M62838 Other muscle spasm: Secondary | ICD-10-CM | POA: Diagnosis not present

## 2020-07-31 DIAGNOSIS — M6281 Muscle weakness (generalized): Secondary | ICD-10-CM | POA: Diagnosis not present

## 2020-08-11 MED FILL — ROSUVASTATIN CALCIUM 10 MG: 10 | 90 days supply | Qty: 90 | Fill #2

## 2020-08-22 ENCOUNTER — Other Ambulatory Visit: Payer: Self-pay

## 2020-08-22 ENCOUNTER — Ambulatory Visit (HOSPITAL_COMMUNITY)
Admission: RE | Admit: 2020-08-22 | Discharge: 2020-08-22 | Disposition: A | Discharge status: Home / Self Care | Payer: 59 | Source: Ambulatory Visit | Attending: Urology | Admitting: Urology

## 2020-08-22 DIAGNOSIS — C61 Malignant neoplasm of prostate: Secondary | ICD-10-CM | POA: Diagnosis not present

## 2020-08-22 DIAGNOSIS — I700 Atherosclerosis of aorta: Secondary | ICD-10-CM | POA: Diagnosis not present

## 2020-08-22 LAB — POCT I-STAT CREATININE: Creatinine, Ser: 1.1 mg/dL (ref 0.61–1.24)

## 2020-08-22 MED ORDER — IOHEXOL 300 MG/ML  SOLN
100.0000 mL | Freq: Once | INTRAMUSCULAR | Status: AC | PRN
  Administered 2020-08-22: 100 mL via INTRAVENOUS

## 2020-09-03 NOTE — Patient Instructions (Addendum)
DUE TO COVID-19 ONLY ONE VISITOR IS ALLOWED TO COME WITH YOU AND STAY IN THE WAITING ROOM ONLY DURING PRE OP AND PROCEDURE.   IF YOU WILL BE ADMITTED INTO THE HOSPITAL YOU ARE ALLOWED ONE SUPPORT PERSON DURING VISITATION HOURS ONLY (10AM -8PM)   . The support person may change daily. . The support person must pass our screening, gel in and out, and wear a mask at all times, including in the patient's room. . Patients must also wear a mask when staff or their support person are in the room.   COVID SWAB TESTING MUST BE COMPLETED ON:  Saturday, 09-06-20 @ 11:55 AM   [address] [place], [place] [address]  (Must self quarantine after testing. Follow instructions on handout.)        Your procedure is scheduled on:  Wednesday, 09-10-20   Report to [facility] Main  Entrance    Report to admitting at 6:30 AM   Call this number if you have problems the morning of surgery [phone]   One bottle of Magnesium Citrate, drink by noon the day of surgery.   Do not eat food :After Midnight.   May have liquids until 5:30 AM day of surgery  CLEAR LIQUID DIET  Foods Allowed                                                                     Foods Excluded  Water, Black Coffee and tea, regular and decaf               liquids that you cannot  Plain Jell-O in any flavor  (No red)                                      see through such as: Fruit ices (not with fruit pulp)                                      milk, soups, orange juice              Iced Popsicles (No red)                                      All solid food                                   Apple juices Sports drinks like Gatorade (No red) Lightly seasoned clear broth or consume(fat free) Sugar, honey syrup  Oral Hygiene is also important to reduce your risk of infection.                                    Remember - BRUSH YOUR TEETH THE MORNING OF SURGERY WITH YOUR REGULAR TOOTHPASTE   Do NOT smoke after  Midnight   Take these medicines the morning of surgery with A  SIP OF WATER: Crestor                                You may not have any metal on your body including jewelry, and body piercings             Do not wear  lotions, powders, perfumes/cologne, or deodorant             Men may shave face and neck.   Do not bring valuables to the hospital. [place].   Contacts, dentures or bridgework may not be worn into surgery.   Bring small overnight bag day of surgery.      Special Instructions: Bring a copy of your healthcare power of attorney and living will documents         the day of surgery if you haven't scanned them in before.              Please read over the following fact sheets you were given: IF YOU HAVE QUESTIONS ABOUT YOUR PRE OP INSTRUCTIONS PLEASE CALL [phone]   [facility] - Preparing for Surgery Before surgery, you can play an important role.  Because skin is not sterile, your skin needs to be as free of germs as possible.  You can reduce the number of germs on your skin by washing with CHG (chlorahexidine gluconate) soap before surgery.  CHG is an antiseptic cleaner which kills germs and bonds with the skin to continue killing germs even after washing. Please DO NOT use if you have an allergy to CHG or antibacterial soaps.  If your skin becomes reddened/irritated stop using the CHG and inform your nurse when you arrive at Short Stay. Do not shave (including legs and underarms) for at least 48 hours prior to the first CHG shower.  You may shave your face/neck.  Please follow these instructions carefully:  1.  Shower with CHG Soap the night before surgery and the  morning of surgery.  2.  If you choose to wash your hair, wash your hair first as usual with your normal  shampoo.  3.  After you shampoo, rinse your hair and body thoroughly to remove the shampoo.                             4.  Use CHG as you would any other liquid  soap.  You can apply chg directly to the skin and wash.  Gently with a scrungie or clean washcloth.  5.  Apply the CHG Soap to your body ONLY FROM THE NECK DOWN.   Do   not use on face/ open                           Wound or open sores. Avoid contact with eyes, ears mouth and   genitals (private parts).                       Wash face,  Genitals (private parts) with your normal soap.             6.  Wash thoroughly, paying special attention to the area where your    surgery  will be performed.  7.  Thoroughly rinse your body with warm water from the neck down.  8.  DO NOT shower/wash with your normal soap after using and rinsing off the CHG Soap.                9.  Pat yourself dry with a clean towel.            10.  Wear clean pajamas.            11.  Place clean sheets on your bed the night of your first shower and do not  sleep with pets. Day of Surgery : Do not apply any lotions/deodorants the morning of surgery.  Please wear clean clothes to the hospital/surgery center.  FAILURE TO FOLLOW THESE INSTRUCTIONS MAY RESULT IN THE CANCELLATION OF YOUR SURGERY  PATIENT SIGNATURE_________________________________  NURSE SIGNATURE__________________________________  ________________________________________________________________________

## 2020-09-03 NOTE — Progress Notes (Addendum)
COVID Vaccine Completed:  x2 Date COVID Vaccine completed:  Booster 05-2020 COVID vaccine manufacturer: [employer]   PCP - Sharilyn Sites, MD Cardiologist -   Chest x-ray -  EKG - 09-04-20 in Epic Stress Test - 04-25-2008 ECHO -  Cardiac Cath -  Pacemaker/ICD device last checked: US Aorta - 03-14-12  Sleep Study -  CPAP -   Fasting Blood Sugar -  Checks Blood Sugar _____ times a day  Blood Thinner Instructions: Aspirin Instructions: Last Dose:  Anesthesia review:   Patient denies shortness of breath, fever, cough and chest pain at PAT appointment.  Pt able to climb a flight of stairs and perform ADLs independently.   Patient verbalized understanding of instructions that were given to them at the PAT appointment. Patient was also instructed that they will need to review over the PAT instructions again at home before surgery.

## 2020-09-04 ENCOUNTER — Encounter (HOSPITAL_COMMUNITY): Payer: Self-pay

## 2020-09-04 ENCOUNTER — Encounter (HOSPITAL_COMMUNITY)
Admission: RE | Admit: 2020-09-04 | Discharge: 2020-09-04 | Disposition: A | Discharge status: Home / Self Care | Payer: 59 | Source: Ambulatory Visit | Attending: Urology | Admitting: Urology

## 2020-09-04 ENCOUNTER — Other Ambulatory Visit: Payer: Self-pay

## 2020-09-04 DIAGNOSIS — Z01818 Encounter for other preprocedural examination: Secondary | ICD-10-CM | POA: Insufficient documentation

## 2020-09-04 DIAGNOSIS — I10 Essential (primary) hypertension: Secondary | ICD-10-CM | POA: Insufficient documentation

## 2020-09-04 HISTORY — DX: Malignant neoplasm of prostate: C61

## 2020-09-04 HISTORY — DX: Hyperlipidemia, unspecified: E78.5

## 2020-09-04 LAB — BASIC METABOLIC PANEL
Anion gap: 10 (ref 5–15)
BUN: 14 mg/dL (ref 6–20)
CO2: 27 mmol/L (ref 22–32)
Calcium: 9.6 mg/dL (ref 8.9–10.3)
Chloride: 104 mmol/L (ref 98–111)
Creatinine, Ser: 1.14 mg/dL (ref 0.61–1.24)
GFR, Estimated: >60 mL/min (ref >60)
Glucose, Bld: 94 mg/dL (ref 70–99)
Potassium: 4.8 mmol/L (ref 3.5–5.1)
Sodium: 141 mmol/L (ref 135–145)

## 2020-09-04 LAB — CBC
HCT: 50.4 % (ref 39.0–52.0)
Hemoglobin: 17.1 g/dL — ABNORMAL HIGH (ref 13.0–17.0)
MCH: 32.4 pg (ref 26.0–34.0)
MCHC: 33.9 g/dL (ref 30.0–36.0)
MCV: 95.6 fL (ref 80.0–100.0)
Platelets: 220 10*3/uL (ref 150–400)
RBC: 5.27 MIL/uL (ref 4.22–5.81)
RDW: 12.3 % (ref 11.5–15.5)
WBC: 7.5 10*3/uL (ref 4.0–10.5)
nRBC: 0 % (ref 0.0–0.2)

## 2020-09-06 ENCOUNTER — Other Ambulatory Visit (HOSPITAL_COMMUNITY)
Admission: RE | Admit: 2020-09-06 | Discharge: 2020-09-06 | Disposition: A | Discharge status: Home / Self Care | Payer: 59 | Source: Ambulatory Visit | Attending: Urology | Admitting: Urology

## 2020-09-06 DIAGNOSIS — Z01812 Encounter for preprocedural laboratory examination: Secondary | ICD-10-CM | POA: Diagnosis not present

## 2020-09-06 DIAGNOSIS — Z20822 Contact with and (suspected) exposure to covid-19: Secondary | ICD-10-CM | POA: Insufficient documentation

## 2020-09-07 LAB — SARS CORONAVIRUS 2 (TAT 6-24 HRS): SARS Coronavirus 2: NEGATIVE

## 2020-09-09 ENCOUNTER — Encounter (HOSPITAL_COMMUNITY): Payer: Self-pay | Admitting: Urology

## 2020-09-09 NOTE — Anesthesia Preprocedure Evaluation (Addendum)
Anesthesia Evaluation  Patient identified by MRN, date of birth, ID band Patient awake    Reviewed: Allergy & Precautions, NPO status , Patient's Chart, lab work & pertinent test results  Airway Mallampati: II  TM Distance: >3 FB Neck ROM: Full    Dental  (+) Teeth Intact, Dental Advisory Given   Pulmonary Current Smoker and Patient abstained from smoking.,  <1/2ppd, no inhalers   Pulmonary exam normal breath sounds clear to auscultation       Cardiovascular hypertension, Pt. on medications Normal cardiovascular exam Rhythm:Regular Rate:Normal     Neuro/Psych negative neurological ROS  negative psych ROS   GI/Hepatic negative GI ROS, Neg liver ROS,   Endo/Other  negative endocrine ROS  Renal/GU negative Renal ROS  negative genitourinary   Musculoskeletal negative musculoskeletal ROS (+)   Abdominal   Peds  Hematology negative hematology ROS (+) hct 50   Anesthesia Other Findings Prostate ca  Reproductive/Obstetrics negative OB ROS                            Anesthesia Physical Anesthesia Plan  ASA: II  Anesthesia Plan: General   Post-op Pain Management:    Induction: Intravenous  PONV Risk Score and Plan: 3 and Ondansetron, Dexamethasone, Midazolam and Treatment may vary due to age or medical condition  Airway Management Planned: Oral ETT  Additional Equipment: None  Intra-op Plan:   Post-operative Plan: Extubation in OR  Informed Consent: I have reviewed the patients History and Physical, chart, labs and discussed the procedure including the risks, benefits and alternatives for the proposed anesthesia with the patient or authorized representative who has indicated his/her understanding and acceptance.     Dental advisory given  Plan Discussed with: CRNA  Anesthesia Plan Comments:        Anesthesia Quick Evaluation

## 2020-09-10 ENCOUNTER — Ambulatory Visit (HOSPITAL_COMMUNITY): Payer: 59 | Admitting: Anesthesiology

## 2020-09-10 ENCOUNTER — Observation Stay (HOSPITAL_COMMUNITY)
Admission: RE | Admit: 2020-09-10 | Discharge: 2020-09-11 | Disposition: A | Discharge status: Home / Self Care | Payer: 59 | Source: Ambulatory Visit | Attending: Urology | Admitting: Urology

## 2020-09-10 ENCOUNTER — Encounter (HOSPITAL_COMMUNITY)
Admission: RE | Disposition: A | Discharge status: Home / Self Care | Payer: Self-pay | Source: Ambulatory Visit | Attending: Urology

## 2020-09-10 ENCOUNTER — Encounter (HOSPITAL_COMMUNITY): Payer: Self-pay | Admitting: Urology

## 2020-09-10 ENCOUNTER — Other Ambulatory Visit (HOSPITAL_COMMUNITY): Payer: Self-pay | Admitting: Physician Assistant

## 2020-09-10 ENCOUNTER — Other Ambulatory Visit: Payer: Self-pay

## 2020-09-10 DIAGNOSIS — C61 Malignant neoplasm of prostate: Principal | ICD-10-CM | POA: Diagnosis present

## 2020-09-10 DIAGNOSIS — K660 Peritoneal adhesions (postprocedural) (postinfection): Secondary | ICD-10-CM | POA: Diagnosis not present

## 2020-09-10 DIAGNOSIS — F17200 Nicotine dependence, unspecified, uncomplicated: Secondary | ICD-10-CM | POA: Diagnosis not present

## 2020-09-10 DIAGNOSIS — I10 Essential (primary) hypertension: Secondary | ICD-10-CM | POA: Diagnosis not present

## 2020-09-10 DIAGNOSIS — D075 Carcinoma in situ of prostate: Secondary | ICD-10-CM | POA: Diagnosis not present

## 2020-09-10 DIAGNOSIS — E785 Hyperlipidemia, unspecified: Secondary | ICD-10-CM | POA: Diagnosis not present

## 2020-09-10 HISTORY — PX: LYMPHADENECTOMY: SHX5960

## 2020-09-10 HISTORY — PX: ROBOT ASSISTED LAPAROSCOPIC RADICAL PROSTATECTOMY: SHX5141

## 2020-09-10 LAB — ABO/RH: ABO/RH(D): A NEG

## 2020-09-10 LAB — HEMOGLOBIN AND HEMATOCRIT, BLOOD
HCT: 50.4 % (ref 39.0–52.0)
Hemoglobin: 16.9 g/dL (ref 13.0–17.0)

## 2020-09-10 LAB — TYPE AND SCREEN
ABO/RH(D): A NEG
Antibody Screen: NEGATIVE

## 2020-09-10 SURGERY — PROSTATECTOMY, RADICAL, ROBOT-ASSISTED, LAPAROSCOPIC
Anesthesia: General | Site: Abdomen

## 2020-09-10 MED ORDER — MIDAZOLAM HCL 2 MG/2ML IJ SOLN
INTRAMUSCULAR | Status: AC
  Filled 2020-09-10: qty 2

## 2020-09-10 MED ORDER — PROPOFOL 10 MG/ML IV BOLUS
INTRAVENOUS | Status: DC | PRN
  Administered 2020-09-10: 150 mg via INTRAVENOUS

## 2020-09-10 MED ORDER — DEXAMETHASONE SODIUM PHOSPHATE 10 MG/ML IJ SOLN
INTRAMUSCULAR | Status: DC | PRN
  Administered 2020-09-10: 10 mg via INTRAVENOUS

## 2020-09-10 MED ORDER — CEFAZOLIN SODIUM-DEXTROSE 2-4 GM/100ML-% IV SOLN
2.0000 g | INTRAVENOUS | Status: AC
  Administered 2020-09-10: 09:00:00 2 g via INTRAVENOUS
  Filled 2020-09-10: qty 100

## 2020-09-10 MED ORDER — PROPOFOL 10 MG/ML IV BOLUS
INTRAVENOUS | Status: AC
  Filled 2020-09-10: qty 40

## 2020-09-10 MED ORDER — CHLORHEXIDINE GLUCONATE CLOTH 2 % EX PADS
6.0000 | MEDICATED_PAD | Freq: Every day | CUTANEOUS | Status: DC
  Administered 2020-09-11: 11:00:00 6 via TOPICAL

## 2020-09-10 MED ORDER — LACTATED RINGERS IV SOLN: INTRAVENOUS | Status: DC

## 2020-09-10 MED ORDER — HYDROMORPHONE HCL 2 MG/ML IJ SOLN
INTRAMUSCULAR | Status: AC
  Filled 2020-09-10: qty 1

## 2020-09-10 MED ORDER — PROPOFOL 10 MG/ML IV BOLUS
INTRAVENOUS | Status: AC
  Filled 2020-09-10: qty 20

## 2020-09-10 MED ORDER — ONDANSETRON HCL 4 MG/2ML IJ SOLN
INTRAMUSCULAR | Status: DC | PRN
  Administered 2020-09-10: 4 mg via INTRAVENOUS

## 2020-09-10 MED ORDER — ACETAMINOPHEN 500 MG PO TABS
1000.0000 mg | ORAL_TABLET | Freq: Four times a day (QID) | ORAL | Status: DC
  Administered 2020-09-11 (×3): 1000 mg via ORAL
  Filled 2020-09-10 (×3): qty 2

## 2020-09-10 MED ORDER — SODIUM CHLORIDE (PF) 0.9 % IJ SOLN
INTRAMUSCULAR | Status: AC
  Filled 2020-09-10: qty 20

## 2020-09-10 MED ORDER — SUGAMMADEX SODIUM 200 MG/2ML IV SOLN
INTRAVENOUS | Status: DC | PRN
  Administered 2020-09-10: 200 mg via INTRAVENOUS

## 2020-09-10 MED ORDER — SULFAMETHOXAZOLE-TRIMETHOPRIM 800-160 MG PO TABS: 1.0000 | ORAL_TABLET | Freq: Two times a day (BID) | ORAL | 0 refills | Status: AC

## 2020-09-10 MED ORDER — HYDROMORPHONE HCL 1 MG/ML IJ SOLN
INTRAMUSCULAR | Status: AC
  Filled 2020-09-10: qty 1

## 2020-09-10 MED ORDER — MIDAZOLAM HCL 5 MG/5ML IJ SOLN
INTRAMUSCULAR | Status: DC | PRN
  Administered 2020-09-10: 2 mg via INTRAVENOUS

## 2020-09-10 MED ORDER — DEXMEDETOMIDINE (PRECEDEX) IN NS 20 MCG/5ML (4 MCG/ML) IV SYRINGE
PREFILLED_SYRINGE | INTRAVENOUS | Status: AC
  Filled 2020-09-10: qty 5

## 2020-09-10 MED ORDER — FENTANYL CITRATE (PF) 250 MCG/5ML IJ SOLN
INTRAMUSCULAR | Status: AC
  Filled 2020-09-10: qty 5

## 2020-09-10 MED ORDER — ONDANSETRON HCL 4 MG/2ML IJ SOLN
4.0000 mg | INTRAMUSCULAR | Status: DC | PRN
  Administered 2020-09-10: 17:00:00 4 mg via INTRAVENOUS
  Filled 2020-09-10: qty 2

## 2020-09-10 MED ORDER — ACETAMINOPHEN 500 MG PO TABS
1000.0000 mg | ORAL_TABLET | Freq: Once | ORAL | Status: AC
  Administered 2020-09-10: 07:00:00 1000 mg via ORAL
  Filled 2020-09-10: qty 2

## 2020-09-10 MED ORDER — BACITRACIN-NEOMYCIN-POLYMYXIN 400-5-5000 EX OINT: 1.0000 "application " | TOPICAL_OINTMENT | Freq: Three times a day (TID) | CUTANEOUS | Status: DC | PRN

## 2020-09-10 MED ORDER — CHLORHEXIDINE GLUCONATE 0.12 % MT SOLN
15.0000 mL | Freq: Once | OROMUCOSAL | Status: AC
  Administered 2020-09-10: 07:00:00 15 mL via OROMUCOSAL

## 2020-09-10 MED ORDER — FENTANYL CITRATE (PF) 100 MCG/2ML IJ SOLN
INTRAMUSCULAR | Status: DC | PRN
  Administered 2020-09-10: 50 ug via INTRAVENOUS
  Administered 2020-09-10: 100 ug via INTRAVENOUS
  Administered 2020-09-10 (×2): 50 ug via INTRAVENOUS

## 2020-09-10 MED ORDER — DEXTROSE-NACL 5-0.45 % IV SOLN: INTRAVENOUS | Status: DC

## 2020-09-10 MED ORDER — OXYCODONE HCL 5 MG PO TABS
5.0000 mg | ORAL_TABLET | Freq: Once | ORAL | Status: AC | PRN
  Administered 2020-09-10: 5 mg via ORAL

## 2020-09-10 MED ORDER — EPHEDRINE SULFATE-NACL 50-0.9 MG/10ML-% IV SOSY
PREFILLED_SYRINGE | INTRAVENOUS | Status: DC | PRN
  Administered 2020-09-10: 5 mg via INTRAVENOUS
  Administered 2020-09-10: 10 mg via INTRAVENOUS

## 2020-09-10 MED ORDER — HYDROMORPHONE HCL 1 MG/ML IJ SOLN
INTRAMUSCULAR | Status: DC | PRN
  Administered 2020-09-10 (×2): 1 mg via INTRAVENOUS

## 2020-09-10 MED ORDER — IRBESARTAN 150 MG PO TABS
150.0000 mg | ORAL_TABLET | Freq: Every day | ORAL | Status: DC
  Administered 2020-09-11: 11:00:00 150 mg via ORAL
  Filled 2020-09-10 (×2): qty 1

## 2020-09-10 MED ORDER — OXYCODONE HCL 5 MG PO TABS
ORAL_TABLET | ORAL | Status: AC
  Filled 2020-09-10: qty 1

## 2020-09-10 MED ORDER — OXYCODONE HCL 5 MG/5ML PO SOLN: 5.0000 mg | Freq: Once | ORAL | Status: AC | PRN

## 2020-09-10 MED ORDER — PROMETHAZINE HCL 25 MG/ML IJ SOLN: 6.2500 mg | INTRAMUSCULAR | Status: DC | PRN

## 2020-09-10 MED ORDER — SODIUM CHLORIDE (PF) 0.9 % IJ SOLN
INTRAMUSCULAR | Status: DC | PRN
  Administered 2020-09-10: 20 mL via INTRAVENOUS

## 2020-09-10 MED ORDER — HYDROMORPHONE HCL 1 MG/ML IJ SOLN: 0.5000 mg | INTRAMUSCULAR | Status: DC | PRN

## 2020-09-10 MED ORDER — ORAL CARE MOUTH RINSE: 15.0000 mL | Freq: Once | OROMUCOSAL | Status: AC

## 2020-09-10 MED ORDER — WATER FOR IRRIGATION, STERILE IR SOLN
Status: DC | PRN
  Administered 2020-09-10: 1000 mL

## 2020-09-10 MED ORDER — DIPHENHYDRAMINE HCL 50 MG/ML IJ SOLN: 12.5000 mg | Freq: Four times a day (QID) | INTRAMUSCULAR | Status: DC | PRN

## 2020-09-10 MED ORDER — LIDOCAINE 2% (20 MG/ML) 5 ML SYRINGE
INTRAMUSCULAR | Status: DC | PRN
  Administered 2020-09-10: 40 mg via INTRAVENOUS

## 2020-09-10 MED ORDER — HYDROCODONE-ACETAMINOPHEN 5-325 MG PO TABS: 1.0000 | ORAL_TABLET | Freq: Four times a day (QID) | ORAL | 0 refills | Status: AC | PRN

## 2020-09-10 MED ORDER — BUPIVACAINE LIPOSOME 1.3 % IJ SUSP
20.0000 mL | Freq: Once | INTRAMUSCULAR | Status: AC
  Administered 2020-09-10: 11:00:00 20 mL
  Filled 2020-09-10: qty 20

## 2020-09-10 MED ORDER — HYDROMORPHONE HCL 1 MG/ML IJ SOLN
0.2500 mg | INTRAMUSCULAR | Status: DC | PRN
  Administered 2020-09-10 (×2): 0.5 mg via INTRAVENOUS

## 2020-09-10 MED ORDER — ROCURONIUM BROMIDE 10 MG/ML (PF) SYRINGE
PREFILLED_SYRINGE | INTRAVENOUS | Status: DC | PRN
  Administered 2020-09-10 (×2): 10 mg via INTRAVENOUS
  Administered 2020-09-10: 100 mg via INTRAVENOUS

## 2020-09-10 MED ORDER — DIPHENHYDRAMINE HCL 12.5 MG/5ML PO ELIX: 12.5000 mg | ORAL_SOLUTION | Freq: Four times a day (QID) | ORAL | Status: DC | PRN

## 2020-09-10 MED ORDER — SODIUM CHLORIDE 0.9 % IV BOLUS
1000.0000 mL | Freq: Once | INTRAVENOUS | Status: AC
  Administered 2020-09-10: 12:00:00 1000 mL via INTRAVENOUS

## 2020-09-10 MED ORDER — NICOTINE 14 MG/24HR TD PT24
14.0000 mg | MEDICATED_PATCH | Freq: Every day | TRANSDERMAL | Status: DC
  Administered 2020-09-10 – 2020-09-11 (×2): 14 mg via TRANSDERMAL
  Filled 2020-09-10 (×2): qty 1

## 2020-09-10 MED ORDER — AMISULPRIDE (ANTIEMETIC) 5 MG/2ML IV SOLN: 10.0000 mg | Freq: Once | INTRAVENOUS | Status: DC | PRN

## 2020-09-10 MED ORDER — GLYCOPYRROLATE PF 0.2 MG/ML IJ SOSY
PREFILLED_SYRINGE | INTRAMUSCULAR | Status: AC
  Filled 2020-09-10: qty 3

## 2020-09-10 MED ORDER — LIDOCAINE HCL (PF) 2 % IJ SOLN
INTRAMUSCULAR | Status: AC
  Filled 2020-09-10: qty 10

## 2020-09-10 MED ORDER — ROCURONIUM BROMIDE 10 MG/ML (PF) SYRINGE
PREFILLED_SYRINGE | INTRAVENOUS | Status: AC
  Filled 2020-09-10: qty 20

## 2020-09-10 MED ORDER — STERILE WATER FOR INJECTION IJ SOLN
INTRAMUSCULAR | Status: DC | PRN
  Administered 2020-09-10: 09:00:00 .4 mL

## 2020-09-10 MED ORDER — ROSUVASTATIN CALCIUM 10 MG PO TABS
10.0000 mg | ORAL_TABLET | Freq: Every day | ORAL | Status: DC
  Administered 2020-09-11: 11:00:00 10 mg via ORAL
  Filled 2020-09-10: qty 1

## 2020-09-10 MED ORDER — HYDROMORPHONE HCL 1 MG/ML IJ SOLN
INTRAMUSCULAR | Status: AC
  Administered 2020-09-10: 12:00:00 0.5 mg via INTRAVENOUS
  Filled 2020-09-10: qty 1

## 2020-09-10 MED ORDER — BELLADONNA ALKALOIDS-OPIUM 16.2-60 MG RE SUPP
1.0000 | Freq: Four times a day (QID) | RECTAL | Status: DC | PRN
Start: 2020-09-10 — End: 2020-09-11

## 2020-09-10 MED ORDER — DOCUSATE SODIUM 100 MG PO CAPS
100.0000 mg | ORAL_CAPSULE | Freq: Two times a day (BID) | ORAL | Status: DC
  Administered 2020-09-10 – 2020-09-11 (×2): 100 mg via ORAL
  Filled 2020-09-10 (×2): qty 1

## 2020-09-10 MED ORDER — ROCURONIUM BROMIDE 10 MG/ML (PF) SYRINGE
PREFILLED_SYRINGE | INTRAVENOUS | Status: AC
  Filled 2020-09-10: qty 10

## 2020-09-10 MED ORDER — ONDANSETRON HCL 4 MG/2ML IJ SOLN
INTRAMUSCULAR | Status: AC
  Filled 2020-09-10: qty 4

## 2020-09-10 MED ORDER — DEXAMETHASONE SODIUM PHOSPHATE 10 MG/ML IJ SOLN
INTRAMUSCULAR | Status: AC
  Filled 2020-09-10: qty 2

## 2020-09-10 MED ORDER — LACTATED RINGERS IR SOLN
Status: DC | PRN
  Administered 2020-09-10: 1000 mL

## 2020-09-10 MED ORDER — OXYCODONE HCL 5 MG PO TABS
5.0000 mg | ORAL_TABLET | ORAL | Status: DC | PRN
  Administered 2020-09-10 – 2020-09-11 (×2): 5 mg via ORAL
  Filled 2020-09-10 (×2): qty 1

## 2020-09-10 MED FILL — HYDROCODON-APAP 5-325: 5-325 | 3 days supply | Qty: 20 | Fill #0

## 2020-09-10 MED FILL — SULFAMETHOXAZOLE-TMP DS TAB: 800-160 | 3 days supply | Qty: 6 | Fill #0

## 2020-09-10 SURGICAL SUPPLY — 75 items
APPLICATOR COTTON TIP 6 STRL (MISCELLANEOUS) ×3 IMPLANT
APPLICATOR COTTON TIP 6IN STRL (MISCELLANEOUS) ×5
BAG URO CATCHER STRL LF (MISCELLANEOUS) IMPLANT
CATH FOLEY 2WAY SLVR 18FR 30CC (CATHETERS) ×5 IMPLANT
CATH TIEMANN FOLEY 18FR 5CC (CATHETERS) ×5 IMPLANT
CHLORAPREP W/TINT 26 (MISCELLANEOUS) ×5 IMPLANT
CLIP VESOLOCK LG 6/CT PURPLE (CLIP) ×10 IMPLANT
CLOTH BEACON ORANGE TIMEOUT ST (SAFETY) ×5 IMPLANT
CNTNR URN SCR LID CUP LEK RST (MISCELLANEOUS) ×3 IMPLANT
CONT SPEC 4OZ STRL OR WHT (MISCELLANEOUS) ×4
COVER SURGICAL LIGHT HANDLE (MISCELLANEOUS) ×5 IMPLANT
COVER TIP SHEARS 8 DVNC (MISCELLANEOUS) ×3 IMPLANT
COVER TIP SHEARS 8MM DA VINCI (MISCELLANEOUS) ×4
COVER WAND RF STERILE (DRAPES) IMPLANT
CUTTER ECHEON FLEX ENDO 45 340 (ENDOMECHANICALS) ×5 IMPLANT
DECANTER SPIKE VIAL GLASS SM (MISCELLANEOUS) IMPLANT
DERMABOND ADVANCED (GAUZE/BANDAGES/DRESSINGS) ×2
DERMABOND ADVANCED .7 DNX12 (GAUZE/BANDAGES/DRESSINGS) ×3 IMPLANT
DRAIN CHANNEL RND F F (WOUND CARE) IMPLANT
DRAPE ARM DVNC X/XI (DISPOSABLE) ×12 IMPLANT
DRAPE COLUMN DVNC XI (DISPOSABLE) ×3 IMPLANT
DRAPE DA VINCI XI ARM (DISPOSABLE) ×16
DRAPE DA VINCI XI COLUMN (DISPOSABLE) ×4
DRAPE SURG IRRIG POUCH 19X23 (DRAPES) ×5 IMPLANT
DRSG TEGADERM 4X4.75 (GAUZE/BANDAGES/DRESSINGS) ×5 IMPLANT
ELECT REM PT RETURN 15FT ADLT (MISCELLANEOUS) ×5 IMPLANT
GAUZE 4X4 16PLY RFD (DISPOSABLE) IMPLANT
GAUZE SPONGE 2X2 8PLY STRL LF (GAUZE/BANDAGES/DRESSINGS) ×3 IMPLANT
GLOVE BIO SURGEON STRL SZ 6.5 (GLOVE) ×4 IMPLANT
GLOVE BIO SURGEONS STRL SZ 6.5 (GLOVE) ×1
GLOVE BIOGEL M STRL SZ7.5 (GLOVE) ×10 IMPLANT
GLOVE BIOGEL PI IND STRL 7.5 (GLOVE) ×3 IMPLANT
GLOVE BIOGEL PI INDICATOR 7.5 (GLOVE) ×2
GOWN STRL REUS W/TWL LRG LVL3 (GOWN DISPOSABLE) ×15 IMPLANT
HOLDER FOLEY CATH W/STRAP (MISCELLANEOUS) ×5 IMPLANT
IRRIG SUCT STRYKERFLOW 2 WTIP (MISCELLANEOUS) ×5
IRRIGATION SUCT STRKRFLW 2 WTP (MISCELLANEOUS) ×3 IMPLANT
IV LACTATED RINGERS 1000ML (IV SOLUTION) ×5 IMPLANT
KIT PROCEDURE DA VINCI SI (MISCELLANEOUS)
KIT PROCEDURE DVNC SI (MISCELLANEOUS) IMPLANT
KIT TURNOVER KIT A (KITS) IMPLANT
MANIFOLD NEPTUNE II (INSTRUMENTS) ×5 IMPLANT
NEEDLE INSUFFLATION 14GA 120MM (NEEDLE) ×5 IMPLANT
NEEDLE SPNL 22GX7 QUINCKE BK (NEEDLE) ×5 IMPLANT
PACK CYSTO (CUSTOM PROCEDURE TRAY) IMPLANT
PACK ROBOT UROLOGY CUSTOM (CUSTOM PROCEDURE TRAY) ×5 IMPLANT
PAD POSITIONING PINK XL (MISCELLANEOUS) ×5 IMPLANT
PENCIL SMOKE EVACUATOR (MISCELLANEOUS) IMPLANT
PORT ACCESS TROCAR AIRSEAL 12 (TROCAR) ×3 IMPLANT
PORT ACCESS TROCAR AIRSEAL 5M (TROCAR) ×2
SEAL CANN UNIV 5-8 DVNC XI (MISCELLANEOUS) ×12 IMPLANT
SEAL XI 5MM-8MM UNIVERSAL (MISCELLANEOUS) ×16
SET TRI-LUMEN FLTR TB AIRSEAL (TUBING) ×5 IMPLANT
SOLUTION ELECTROLUBE (MISCELLANEOUS) ×5 IMPLANT
SPONGE GAUZE 2X2 STER 10/PKG (GAUZE/BANDAGES/DRESSINGS) ×2
SPONGE LAP 4X18 RFD (DISPOSABLE) ×5 IMPLANT
STAPLE RELOAD 45 GRN (STAPLE) ×3 IMPLANT
STAPLE RELOAD 45MM GREEN (STAPLE) ×3
SUT ETHILON 3 0 PS 1 (SUTURE) ×5 IMPLANT
SUT MNCRL AB 4-0 PS2 18 (SUTURE) ×10 IMPLANT
SUT NOVA NAB GS-21 0 18 T12 DT (SUTURE) ×5 IMPLANT
SUT PDS AB 1 CT1 27 (SUTURE) ×10 IMPLANT
SUT VIC AB 2-0 SH 27 (SUTURE) ×4
SUT VIC AB 2-0 SH 27X BRD (SUTURE) ×3 IMPLANT
SUT VICRYL 0 UR6 27IN ABS (SUTURE) ×5 IMPLANT
SUT VLOC BARB 180 ABS3/0GR12 (SUTURE) ×15
SUTURE VLOC BRB 180 ABS3/0GR12 (SUTURE) ×9 IMPLANT
SYR 27GX1/2 1ML LL SAFETY (SYRINGE) ×5 IMPLANT
SYR CONTROL 10ML LL (SYRINGE) IMPLANT
TOWEL OR NON WOVEN STRL DISP B (DISPOSABLE) ×5 IMPLANT
TROCAR XCEL NON-BLD 5MMX100MML (ENDOMECHANICALS) IMPLANT
TUBING CONNECTING 10 (TUBING) IMPLANT
TUBING CONNECTING 10' (TUBING)
WATER STERILE IRR 1000ML POUR (IV SOLUTION) ×5 IMPLANT
WATER STERILE IRR 3000ML UROMA (IV SOLUTION) IMPLANT

## 2020-09-10 NOTE — Anesthesia Procedure Notes (Signed)
Procedure Name: Intubation Date/Time: 09/10/2020 8:52 AM Performed by: Lavina Hamman, CRNA Pre-anesthesia Checklist: Patient identified, Emergency Drugs available, Suction available, Patient being monitored and Timeout performed Patient Re-evaluated:Patient Re-evaluated prior to induction Oxygen Delivery Method: Circle system utilized Preoxygenation: Pre-oxygenation with 100% oxygen Induction Type: IV induction Ventilation: Mask ventilation without difficulty and Oral airway inserted - appropriate to patient size Laryngoscope Size: Mac and 4 Grade View: Grade II Tube type: Oral Tube size: 7.5 mm Number of attempts: 1 Airway Equipment and Method: Stylet Placement Confirmation: ETT inserted through vocal cords under direct vision,  positive ETCO2,  CO2 detector and breath sounds checked- equal and bilateral Secured at: 23 cm Tube secured with: Tape Dental Injury: Teeth and Oropharynx as per pre-operative assessment  Comments: ATOI

## 2020-09-10 NOTE — Anesthesia Postprocedure Evaluation (Signed)
Anesthesia Post Note  Patient: [name]  Procedure(s) Performed: XI ROBOTIC ASSISTED LAPAROSCOPIC RADICAL PROSTATECTOMY (N/A Abdomen) LYMPHADENECTOMY (Bilateral Abdomen)     Patient location during evaluation: PACU Anesthesia Type: General Level of consciousness: awake and alert, oriented and patient cooperative Pain management: pain level controlled Vital Signs Assessment: post-procedure vital signs reviewed and stable Respiratory status: spontaneous breathing, nonlabored ventilation and respiratory function stable Cardiovascular status: blood pressure returned to baseline and stable Postop Assessment: no apparent nausea or vomiting Anesthetic complications: no   No complications documented.  Last Vitals:  Vitals:   09/10/20 1245 09/10/20 1300  BP: 139/76 (!) 142/82  Pulse:  70  Resp:  16  Temp:    SpO2:  100%    Last Pain:  Vitals:   09/10/20 1300  TempSrc:   PainSc: [place]

## 2020-09-10 NOTE — Brief Op Note (Signed)
09/10/2020  11:21 AM  PATIENT:  [name]  59 y.o. male  PRE-OPERATIVE DIAGNOSIS:  PROSTATE CANCER  POST-OPERATIVE DIAGNOSIS:  PROSTATE CANCER  PROCEDURE:  Procedure(s) with comments: XI ROBOTIC ASSISTED LAPAROSCOPIC RADICAL PROSTATECTOMY (N/A) - 3 HRS LYMPHADENECTOMY (Bilateral)  SURGEON:  Surgeon(s) and Role:    * Alexis Frock, MD - Primary  PHYSICIAN ASSISTANT:   ASSISTANTS: Clemetine Marker PA   ANESTHESIA:   local and general  EBL:  75 mL   BLOOD ADMINISTERED:none  DRAINS: 1 - foley to gravity; 2 - JP to bulb   LOCAL MEDICATIONS USED:  MARCAINE     SPECIMEN:  Source of Specimen:  1 - periprostatic fat; 2- prostatectomy; 3 - pelvic lymph nodes  DISPOSITION OF SPECIMEN:  PATHOLOGY  COUNTS:  YES  TOURNIQUET:  * No tourniquets in log *  DICTATION: .Other Dictation: Dictation Number [id number]   PLAN OF CARE: Admit for overnight observation  PATIENT DISPOSITION:  PACU - hemodynamically stable.   Delay start of Pharmacological VTE agent (>24hrs) due to surgical blood loss or risk of bleeding: yes

## 2020-09-10 NOTE — Transfer of Care (Signed)
Immediate Anesthesia Transfer of Care Note  Patient: [name]  Procedure(s) Performed: Procedure(s) with comments: XI ROBOTIC ASSISTED LAPAROSCOPIC RADICAL PROSTATECTOMY (N/A) - 3 HRS LYMPHADENECTOMY (Bilateral)  Patient Location: PACU  Anesthesia Type:General  Level of Consciousness:  sedated, patient cooperative and responds to stimulation  Airway & Oxygen Therapy:Patient Spontanous Breathing and Patient connected to face mask oxgen  Post-op Assessment:  Report given to PACU RN and Post -op Vital signs reviewed and stable  Post vital signs:  Reviewed and stable  Last Vitals:  Vitals:   09/10/20 0720  BP: 121/62  Pulse: 61  Resp: 18  Temp: 36.7 C  SpO2: 17%    Complications: No apparent anesthesia complications

## 2020-09-10 NOTE — Discharge Instructions (Signed)

## 2020-09-10 NOTE — H&P (Signed)
[name] is an 59 y.o. male.    Chief Complaint: Pre-Op Prostatectomy  HPI:   1 - Moderate Risk Prostate Cancer - 4/12 cores up to 30% mix of grade 1 and 2 cancer by BX 2021 on eval PSA 4.8. TRUS 10mL, no median lobe.   PMH sig for anterior approach L-S spine surgery (scar infraumbilical to pubis), HTN, HLD, tachyarrythmia (negative eval x severl). 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 Alyson Ingles.   Today "[name]" is seen to proceed with prostatectomy / node dissection. No interval fevers. C19 screen negative. Hgb 17.1, Cr 1.1. Interval CT with preserved anterior abdominal fat planes, some loss of space of retzius and anticipsted given prior surgery.     Past Medical History:  Diagnosis Date  . Hyperlipidemia   . Hypertension   . Prostate cancer [facility])     Past Surgical History:  Procedure Laterality Date  . BACK SURGERY  2010  . COLONOSCOPY N/A 07/23/2016   Procedure: COLONOSCOPY;  Surgeon: William Dolin, MD;  Location: [facility];  Service: Endoscopy;  Laterality: N/A;  10:00 AM  . PROSTATE BIOPSY      History reviewed. No pertinent family history. Social History:  reports that he has been smoking. He has never used smokeless tobacco. He reports current alcohol use. He reports that he does not use drugs.  Allergies: No Known Allergies  No medications prior to admission.    No results found for this or any previous visit (from the past 48 hour(s)). No results found.  Review of Systems  Constitutional: Negative for chills and fever.  All other systems reviewed and are negative.   There were no vitals taken for this visit. Physical Exam Vitals reviewed.  HENT:     Head: Normocephalic.     Nose: Nose normal.     Mouth/Throat:     Mouth: Mucous membranes are moist.  Eyes:     Pupils: Pupils are equal, round, and reactive to light.  Cardiovascular:     Rate and Rhythm: Normal rate.  Pulmonary:     Effort: Pulmonary effort is  normal.  Abdominal:     General: Abdomen is flat.     Comments: Stable infraumbilical scar w/o hernias.   Genitourinary:    Comments: NO CVAT Musculoskeletal:        General: Normal range of motion.     Cervical back: Normal range of motion.  Skin:    General: Skin is warm.  Neurological:     General: No focal deficit present.     Mental Status: He is alert.  Psychiatric:        Mood and Affect: Mood normal.      Assessment/Plan  Proceed as planned with prostatectomy + ICG + node dissection. Risks, benefits, alternatives, expected peri-op course disucssed previously and reiterated today. He understands that his extensive smoking history and prior surgery do increase risk of peri-op complications and slowed healing.   Alexis Frock, MD 09/10/2020, 6:34 AM

## 2020-09-11 ENCOUNTER — Encounter (HOSPITAL_COMMUNITY): Payer: Self-pay | Admitting: Urology

## 2020-09-11 DIAGNOSIS — F17200 Nicotine dependence, unspecified, uncomplicated: Secondary | ICD-10-CM | POA: Diagnosis not present

## 2020-09-11 DIAGNOSIS — I10 Essential (primary) hypertension: Secondary | ICD-10-CM | POA: Diagnosis not present

## 2020-09-11 DIAGNOSIS — C61 Malignant neoplasm of prostate: Secondary | ICD-10-CM | POA: Diagnosis not present

## 2020-09-11 LAB — BASIC METABOLIC PANEL
Anion gap: 8 (ref 5–15)
BUN: 11 mg/dL (ref 6–20)
CO2: 24 mmol/L (ref 22–32)
Calcium: 8.4 mg/dL — ABNORMAL LOW (ref 8.9–10.3)
Chloride: 104 mmol/L (ref 98–111)
Creatinine, Ser: 0.8 mg/dL (ref 0.61–1.24)
GFR, Estimated: >60 mL/min (ref >60)
Glucose, Bld: 117 mg/dL — ABNORMAL HIGH (ref 70–99)
Potassium: 4.3 mmol/L (ref 3.5–5.1)
Sodium: 136 mmol/L (ref 135–145)

## 2020-09-11 LAB — HEMOGLOBIN AND HEMATOCRIT, BLOOD
HCT: 44.3 % (ref 39.0–52.0)
Hemoglobin: 14.9 g/dL (ref 13.0–17.0)

## 2020-09-11 NOTE — Discharge Summary (Signed)
Physician Discharge Summary  Patient ID: [name] MRN: [id number] DOB/AGE: April 21, 1961 59 y.o.  Admit date: 09/10/2020 Discharge date: 09/11/2020  Admission Diagnoses: Prostate Cancer  Discharge Diagnoses:  Active Problems:   Prostate cancer [facility])   Discharged Condition: good  Hospital Course: Pt underwent robotic prostatectomy with node dissection on 09/10/20, the day of admission, without acute complication. He was observed overnight on the Urology service. By the early afternoon of POD 1, the day of discharge, he is ambulatory, maintaining PO hydration/nutrition, pain controlled on PO meds, and felt to be adequate for discharge. JP removed as output minimal. Hgb 14.9's, Cr 0.8. Final path pending at discharge.    Consults: None  Significant Diagnostic Studies: labs: as per above  Treatments: surgery: as per above  Discharge Exam: Blood pressure (!) 145/80, pulse 79, temperature 99.3 F (37.4 C), temperature source Oral, resp. rate 17, SpO2 98 %. General appearance: alert, cooperative and very pleasant, at baseline.  Ears: no lesions Nose: Nares normal. Septum midline. Mucosa normal. No drainage or sinus tenderness. Throat: lips, mucosa, and tongue normal; teeth and gums normal Neck: supple, symmetrical, trachea midline Back: symmetric, no curvature. ROM normal. No CVA tenderness. Resp: Non-labored on room air.  Cardio: Nl rate GI: soft, non-tender; bowel sounds normal; no masses,  no organomegaly and recent port / extraction sites c/d/i. JP with non-foul serosanguinous fluied. Removed and dry dressing applied.  Male genitalia: normal, foley in place wtih medium yellow urine that is non-foul.  Extremities: extremities normal, atraumatic, no cyanosis or edema Skin: Skin color, texture, turgor normal. No rashes or lesions Lymph nodes: Cervical, supraclavicular, and axillary nodes normal. Neurologic: Grossly normal  Disposition: HOME   Allergies as of 09/11/2020   No  Known Allergies     Medication List    STOP taking these medications   ascorbic acid 500 MG tablet Commonly known as: VITAMIN C   aspirin EC 81 MG tablet   levofloxacin 750 MG tablet Commonly known as: Levaquin   multivitamin with minerals Tabs tablet     TAKE these medications   HYDROcodone-acetaminophen 5-325 MG tablet Commonly known as: Norco Take 1-2 tablets by mouth every 6 (six) hours as needed for moderate pain.   olmesartan 20 MG tablet Commonly known as: BENICAR Take 20 mg by mouth daily.   rosuvastatin 10 MG tablet Commonly known as: CRESTOR Take 10 mg by mouth daily.   sulfamethoxazole-trimethoprim 800-160 MG tablet Commonly known as: BACTRIM DS Take 1 tablet by mouth 2 (two) times daily. Start the day prior to foley removal appointment   tadalafil 5 MG tablet Commonly known as: CIALIS Take 5 mg by mouth daily as needed for erectile dysfunction.       Follow-up Information    Alexis Frock, MD On 09/22/2020.   Specialty: Urology Why: at 9 AM for Catheter removal.  Contact information: [address] [place] [address] [phone]               Signed: Alexis Frock 09/11/2020, 12:06 PM

## 2020-09-11 NOTE — Progress Notes (Signed)
1 Day Post-Op Subjective: Doing well Pain controlled Good UOP Hgb stable No nausea/vomiting  Objective: Vital signs in last 24 hours: Temp:  [97.5 F (36.4 C)-98.1 F (36.7 C)] 97.9 F (36.6 C) (12/16 0306) Pulse Rate:  [64-77] 64 (12/16 0306) Resp:  [13-20] 18 (12/16 0506) BP: (119-160)/(74-93) 132/75 (12/16 0306) SpO2:  [97 %-100 %] 98 % (12/16 0306)  Intake/Output from previous day: 12/15 0701 - 12/16 0700 In: 3500.8 [P.O.:480; I.V.:3020.8] Out: 3525 [Urine:3200; Drains:250; Blood:75] Intake/Output this shift: No intake/output data recorded.  Physical Exam:  General: Alert and oriented CV: RRR Lungs: Clear Abdomen: Soft, ND Incisions: c/d/i with glue intact Ext: NT, No erythema  Lab Results: Recent Labs    09/10/20 1145 09/11/20 0533  HGB 16.9 14.9  HCT 50.4 44.3   BMET Recent Labs    09/11/20 0533  NA 136  K 4.3  CL 104  CO2 24  GLUCOSE 117*  BUN 11  CREATININE 0.80  CALCIUM 8.4*     Studies/Results: No results found.  Assessment/Plan: POD#1 s/p robotic radical prostatectomy. Recovering appropriately this am  - regular diet - medlock - encourage OOB/walking - foley to drainage - likely home this afternoon if feeling well - drain to be removed prior to discharge    LOS: 0 days   [name] 09/11/2020, 7:23 AM

## 2020-09-12 NOTE — Op Note (Signed)
NAMECRISTON, [name] MEDICAL RECORD [id number] ACCOUNT [id number] DATE OF BIRTH:1960/10/08 FACILITY: [facility] LOCATION: [facility] PHYSICIAN:Rhona Fusilier, MD  OPERATIVE REPORT  DATE OF PROCEDURE:  09/10/2020  PREOPERATIVE DIAGNOSIS:  Moderate risk prostate cancer.  PROCEDURES:  1.  Robotic-assisted laparoscopic radical prostatectomy. 2.  Bilateral pelvic lymphadenectomy.  ESTIMATED BLOOD LOSS:  100 mL.  COMPLICATIONS:  None.  SPECIMENS: 1.  Periprosthetic fat. 2.  Right external iliac lymph nodes, sentinel. 3.  Right obturator lymph nodes. 4.  Left external iliac lymph nodes, sentinel. 5.  Left obturator lymph nodes. 6.  Radical prostatectomy.  ASSISTANT:  Bari Mantis, PA  DRAINS: 1.  Jackson-Pratt drain to bulb suction. 2.  Foley catheter to straight drain.  FINDINGS: 1.  Minimal intra-abdominal adhesions despite his prior surgery. 2.  Sentinel lymph nodes within bilateral external iliac groups. 3.  Very small congenital umbilical hernia.  This was closed with the extraction site.  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  This  was clinically localized.  He has surgical history including  anterior approach lumbosacral fixation.  Options discussed for management including surveillance protocols versus ablative therapies versus surgical extirpation, and he adamantly wished to proceed with prostatectomy with curative intent.  Informed  consent was obtained and placed in medical record.  His CT scan revealed preserved fat planes anteriorly, a very small congenital umbilical hernia.  DESCRIPTION OF PROCEDURE:  The patient being [name], procedure being radical prostatectomy with node dissection was confirmed.  Procedure timeout was performed.  Intravenous antibiotics were administered.  General endotracheal anesthesia was   induced.  The patient was placed into a low lithotomy position.  Sterile field was created, prepped and draped base of the penis, perineum, and proximal thighs using iodine and his infra-xiphoid abdomen using chlorhexidine gluconate after clipper shaving  and further fashioning the operative table using 3-inch tape over foam padding across the supraxiphoid chest.  A test of steep Trendelenburg positioning was performed, and he was found to be suitably positioned.  Foley catheter was placed free to  straight drain.  Next, a high-flow, low-pressure pneumoperitoneum was obtained using Veress technique in the supraumbilical midline having passed the aspiration and drop test and verifying area that appeared to be safe based on prior CT scan.  Next, an 8  mm camera port was placed in the same location.  Laparoscopic examination revealed some loose adhesions in the pelvis, mostly periepiploic fat, and some mild adhesions of the sigmoid consistent with prior surgery.  No visceral injury noted.  Additional  ports were placed as follows:  Right paramedian 8 mm robotic port, right far lateral 12 mm assist port, right paramedian 5 mm suction port, left paramedian 8 mm robotic port, left far lateral 8 mm robotic port.  Robot was docked and passed electronic  checks.  Initial attention was directed at limited adhesiolysis.  Several loose adhesions were taken down of the descending colon, mostly periepiploic fat to anterior abdominal wall and additional adhesiolysis was performed of the sigmoid region away  from the left peritoneum to allow better access to the iliac vessels and that dissection plane later resulted in complete resolution of all pertinent adhesions.  Attention was directed at development of space of Retzius.  Incision was made lateral to the  left median umbilical ligament from the midline towards the internal ring coursing along the iliac vessels towards the area  of the left ureter, which was positively  identified.  Left vas deferens was encountered following maneuvers and purposely ligated  using medial bucket handle and the left bladder wall swept away from the pelvic sidewall towards the area of the prostate.  A mirror image dissection was performed on the right side releasing the right bladder wall away from the pelvic sidewall.   Anterior attachments were taken down using cautery scissors.  This exposed the anterior base of the prostate, which was defatted to better denote the bladder neck prostate junction.  This fat was set aside and labeled as periprosthetic fat.  Next, 0.2 mL  of indocyanine green dye was injected into each lobe of the prostate using percutaneously placed robotically guided spinal needle with intervening dissection to prevent dye spillage.  This resulted in excellent parenchymal uptake of the prostate.  Next,  the endopelvic fascia was carefully swept away from the lateral aspect of the prostate and base to apex orientation.  This exposed the dorsal venous complex, which was carefully controlled using a green load stapler taking exquisite care to avoid any  injury to the membranous urethra, which did not occur.  Then, approximately 10 minutes post dye injection, the pelvis was inspected under near infrared fluorescence light.  Sentinel lymphangiography revealed excellent parenchymal uptake of the prostate and sentinel lymphatic channels coursing towards the pelvic lymph node fields bilaterally.  Attention was then directed at right-sided pelvic lymph node dissection.  First,  the right external group was dissected free with the boundaries being right external iliac artery, vein, pelvic sidewall, iliac bifurcation.  Lymphostasis was achieved with cold clips, set aside and labeled right external iliac lymph nodes sentinel, as  there were several sentinel lymph nodes in this area.  Next, right obturator group was dissected free with the boundaries being right external iliac vein,  pelvic sidewall, obturator nerve.  Lymphostasis achieved with cold clips, set aside and labeled  right obturator lymph nodes.   Right obturator nerve was inspected following maneuvers and found to be uninjured.  There were no sentinel lymph nodes in this packet or additional ones in the right hemipelvis.  A mirror image lymphadenectomy was performed  on the left side of left external iliac and left obturator group respectively.  Left external group also housed some sentinel lymph nodes.  Left obturator nerve was inspected following maneuvers and found to be uninjured.  Attention was directed to  bladder dissection.  Bladder neck was identified by moving the catheter back and forth, and a lateral release was performed on each side to better denote the bladder neck prostate junction caliber, and the bladder neck was separated from the base of the  prostate in anterior and posterior direction keeping what appeared to be a rim of circular muscle fibers in each plane of dissection.  This resulted in very good caliber bladder neck with new preservation of all pertinent musculature.  Posterior  dissection was performed by incising approximately 7 mm inferior posterior to the posterior lip of the prostate entering the plane of Denonvilliers.  Bilateral seminal vesicles were encountered, dissected for a distance of approximately 4 cm, ligated and  placed on gentle superior traction.  Bilateral seminal vesicles were dissected to their tips and placed on gentle superior traction.  Dissection proceeded within this plane towards the area of the apex of the prostate.  This exposed the vascular  pedicles on each side.  These were controlled using a sequential clipping technique in a base to apex  orientation performing purposeful wide dissection towards the area of the base, a purposeful very narrow dissection towards the area of the apex  performing moderate nerve sparing bilaterally, but keeping a wide margin towards the  area of the known base location tumor.  Final apical dissection was performed in the anterior plane placing the prostate on gentle superior traction and transecting the  membranous urethra coldly.  This completely freed the prostate specimen.  It was placed in EndoCatch bag for later retrieval.  Next, digital rectal exam was performed using indicator glove and laparoscopic vision and no evidence of rectal violation was  noted.  Posterior reconstruction was performed using 3-0 V-Loc suture reapproximating the posterior urethral plate, the posterior bladder neck, bringing these structures into tension free apposition.  Next, mucosa-to-mucosa anastomosis was performed  using double arm 3-0 V-Loc suture reapproximating the membranous urethra to the bladder neck from the 6 o'clock to 12 o'clock position, and a Foley catheter was then placed per urethra, which irrigated quantitatively.  All sponge and needle counts were  correct.  Hemostasis was excellent.  A closed suction drain was brought out the previous left lateral most robotic port site into the peritoneal cavity away from the area of anastomosis.  Robot was then undocked.  The previous right lateral most  assistant port site was closed at level of fascia using Carter-Thomason suture passer and 0 Vicryl.  Specimen was retrieved by extending the previous camera port site inferiorly for a distance of approximately 3 cm purposely incorporating the small  congenital umbilical hernia.  This extraction site was then closed at level of fascia using figure-of-eight Novafil x3 followed by reapproximation of Scarpa's with running Vicryl.  All incision sites were infiltrated with dilute lipolyzed Marcaine and  closed at the level of the skin using subcuticular Monocryl followed by Dermabond.  The procedure was then terminated.  The patient tolerated the procedure well.  No immediate complications.  The patient was taken to the postanesthesia care to admission  .  Plan  for observation admission, likely discharge home tomorrow versus the following day.  Please note, first assistant, Bari Mantis, was crucial for all portions of the surgery today.  She provided invaluable retraction, suctioning, vascular stapling, lymphatic clipping, and general first assistance.  IN/NUANCE  D:09/10/2020 T:09/10/2020 JOB:[id number]

## 2020-09-16 LAB — SURGICAL PATHOLOGY

## 2020-10-09 DIAGNOSIS — M6289 Other specified disorders of muscle: Secondary | ICD-10-CM | POA: Diagnosis not present

## 2020-10-09 DIAGNOSIS — M6281 Muscle weakness (generalized): Secondary | ICD-10-CM | POA: Diagnosis not present

## 2020-10-09 DIAGNOSIS — N393 Stress incontinence (female) (male): Secondary | ICD-10-CM | POA: Diagnosis not present

## 2020-10-09 DIAGNOSIS — M62838 Other muscle spasm: Secondary | ICD-10-CM | POA: Diagnosis not present

## 2020-10-20 MED FILL — OLMESARTAN MEDOXOMIL 20 MG: 20 | 90 days supply | Qty: 90 | Fill #3

## 2020-11-06 DIAGNOSIS — M62838 Other muscle spasm: Secondary | ICD-10-CM | POA: Diagnosis not present

## 2020-11-06 DIAGNOSIS — M6289 Other specified disorders of muscle: Secondary | ICD-10-CM | POA: Diagnosis not present

## 2020-11-06 DIAGNOSIS — N393 Stress incontinence (female) (male): Secondary | ICD-10-CM | POA: Diagnosis not present

## 2020-11-06 DIAGNOSIS — M6281 Muscle weakness (generalized): Secondary | ICD-10-CM | POA: Diagnosis not present

## 2020-11-11 MED FILL — ROSUVASTATIN CALCIUM 10 MG: 10 | 90 days supply | Qty: 90 | Fill #3

## 2020-12-16 ENCOUNTER — Other Ambulatory Visit: Payer: 59

## 2020-12-16 ENCOUNTER — Other Ambulatory Visit: Payer: Self-pay

## 2020-12-16 DIAGNOSIS — C61 Malignant neoplasm of prostate: Secondary | ICD-10-CM

## 2020-12-16 DIAGNOSIS — R972 Elevated prostate specific antigen [PSA]: Secondary | ICD-10-CM

## 2020-12-17 LAB — PSA: Prostate Specific Ag, Serum: <0.1 ng/mL (ref 0.0–4.0)

## 2020-12-23 DIAGNOSIS — N393 Stress incontinence (female) (male): Secondary | ICD-10-CM | POA: Diagnosis not present

## 2020-12-23 DIAGNOSIS — N529 Male erectile dysfunction, unspecified: Secondary | ICD-10-CM | POA: Diagnosis not present

## 2020-12-23 DIAGNOSIS — C61 Malignant neoplasm of prostate: Secondary | ICD-10-CM | POA: Diagnosis not present

## 2020-12-23 NOTE — Progress Notes (Signed)
Results sent via my chart 

## 2020-12-30 ENCOUNTER — Other Ambulatory Visit (HOSPITAL_COMMUNITY): Payer: Self-pay | Admitting: *Deleted

## 2021-01-06 ENCOUNTER — Other Ambulatory Visit (HOSPITAL_COMMUNITY): Payer: Self-pay

## 2021-01-07 ENCOUNTER — Ambulatory Visit (HOSPITAL_COMMUNITY)
Admission: RE | Admit: 2021-01-07 | Discharge: 2021-01-07 | Disposition: A | Discharge status: Home / Self Care | Payer: Self-pay | Source: Ambulatory Visit | Attending: Cardiology | Admitting: Cardiology

## 2021-01-12 ENCOUNTER — Other Ambulatory Visit (HOSPITAL_COMMUNITY): Payer: Self-pay

## 2021-01-12 MED ORDER — OLMESARTAN MEDOXOMIL 20 MG PO TABS
20.0000 mg | ORAL_TABLET | Freq: Every day | ORAL | 3 refills | Status: DC
  Filled 2021-01-12: qty 90, 90d supply, fill #0
  Filled 2021-04-20: qty 90, 90d supply, fill #1
  Filled 2021-07-20: qty 90, 90d supply, fill #2
  Filled 2021-10-16: qty 90, 90d supply, fill #3

## 2021-01-26 DIAGNOSIS — N529 Male erectile dysfunction, unspecified: Secondary | ICD-10-CM | POA: Diagnosis not present

## 2021-02-10 ENCOUNTER — Other Ambulatory Visit (HOSPITAL_COMMUNITY): Payer: Self-pay

## 2021-02-10 MED ORDER — ROSUVASTATIN CALCIUM 10 MG PO TABS
10.0000 mg | ORAL_TABLET | Freq: Every day | ORAL | 3 refills | Status: DC
  Filled 2021-02-10: qty 90, 90d supply, fill #0
  Filled 2021-05-11: qty 90, 90d supply, fill #1
  Filled 2021-08-10: qty 90, 90d supply, fill #2
  Filled 2021-11-09: qty 90, 90d supply, fill #3

## 2021-04-20 ENCOUNTER — Other Ambulatory Visit (HOSPITAL_COMMUNITY): Payer: Self-pay

## 2021-05-11 ENCOUNTER — Other Ambulatory Visit (HOSPITAL_COMMUNITY): Payer: Self-pay

## 2021-06-18 ENCOUNTER — Other Ambulatory Visit: Payer: Self-pay

## 2021-06-18 DIAGNOSIS — C61 Malignant neoplasm of prostate: Secondary | ICD-10-CM | POA: Diagnosis not present

## 2021-06-19 LAB — PSA: Prostate Specific Ag, Serum: <0.1 ng/mL (ref 0.0–4.0)

## 2021-06-23 ENCOUNTER — Other Ambulatory Visit (HOSPITAL_COMMUNITY): Payer: Self-pay

## 2021-06-23 DIAGNOSIS — C61 Malignant neoplasm of prostate: Secondary | ICD-10-CM | POA: Diagnosis not present

## 2021-06-23 DIAGNOSIS — N529 Male erectile dysfunction, unspecified: Secondary | ICD-10-CM | POA: Diagnosis not present

## 2021-06-23 DIAGNOSIS — N393 Stress incontinence (female) (male): Secondary | ICD-10-CM | POA: Diagnosis not present

## 2021-06-23 MED ORDER — DULOXETINE HCL 40 MG PO CPEP
1.0000 | ORAL_CAPSULE | ORAL | 11 refills | Status: AC
  Filled 2021-06-23 – 2021-07-01 (×2): qty 60, 30d supply, fill #0

## 2021-06-29 NOTE — Progress Notes (Signed)
Results sent via my chart 

## 2021-07-01 ENCOUNTER — Other Ambulatory Visit (HOSPITAL_COMMUNITY): Payer: Self-pay

## 2021-07-20 ENCOUNTER — Other Ambulatory Visit (HOSPITAL_COMMUNITY): Payer: Self-pay

## 2021-08-10 ENCOUNTER — Other Ambulatory Visit (HOSPITAL_COMMUNITY): Payer: Self-pay

## 2021-08-27 ENCOUNTER — Other Ambulatory Visit (HOSPITAL_COMMUNITY): Payer: Self-pay

## 2021-08-27 MED ORDER — KETOROLAC TROMETHAMINE 10 MG PO TABS
ORAL_TABLET | ORAL | 0 refills | Status: AC
  Filled 2021-08-27: qty 16, 4d supply, fill #0

## 2021-08-27 MED ORDER — HYDROCODONE-ACETAMINOPHEN 7.5-325 MG PO TABS
0.5000 | ORAL_TABLET | ORAL | 0 refills | Status: AC
  Filled 2021-08-27: qty 6, 2d supply, fill #0

## 2021-10-16 ENCOUNTER — Other Ambulatory Visit (HOSPITAL_COMMUNITY): Payer: Self-pay

## 2021-11-09 ENCOUNTER — Other Ambulatory Visit (HOSPITAL_COMMUNITY): Payer: Self-pay

## 2021-11-11 ENCOUNTER — Other Ambulatory Visit (HOSPITAL_COMMUNITY): Payer: Self-pay

## 2021-12-10 IMAGING — CT CT CARDIAC CORONARY ARTERY CALCIUM SCORE
2 series · 15 of 20 positions shown, 17 images · non-contrast
Comparison: None.
COMPARISON: None.

Addendum:
EXAM:
OVER-READ INTERPRETATION  CT CHEST

The following report is an over-read performed by radiologist Dr.
Ellie-Mai Missen [[email or web address]] on 01/07/2021. This
over-read does not include interpretation of cardiac or coronary
anatomy or pathology. The coronary calcium score interpretation by
the cardiologist is attached.
CLINICAL DATA: Cardiovascular Disease Risk stratification
Coronary Calcium Score
TECHNIQUE: A gated, non-contrast computed tomography scan of the heart was
performed using 3mm slice thickness. Axial images were analyzed on a
dedicated workstation. Calcium scoring of the coronary arteries was
performed using the Agatston method.

[Series 2: ct hrt calcium 70 % · axial · 0.70mm/px · z∈[+1396,+1522]mm · 8 of 55 slices shown, 10 images]
[im 7/55  vessel]
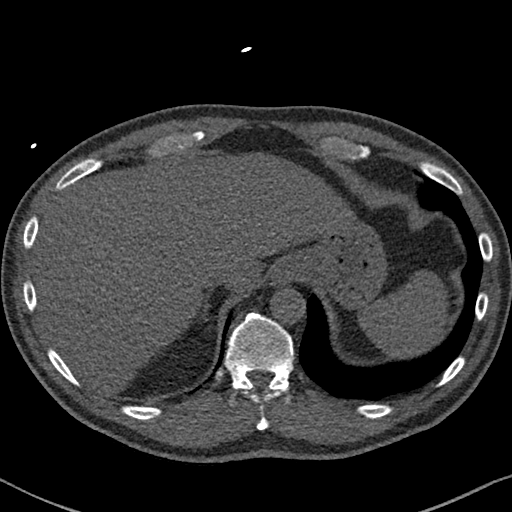
[im 7/55  lung]
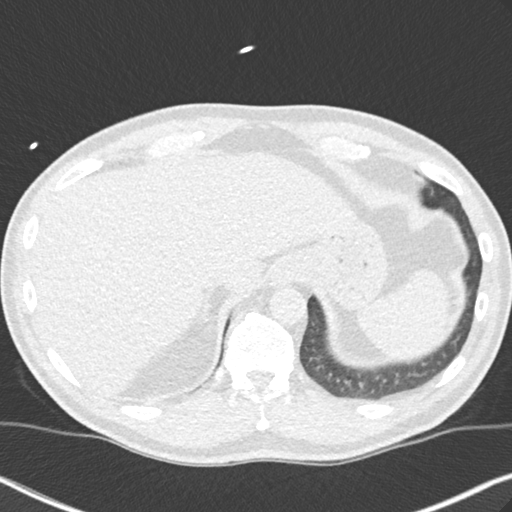
[im 13/55  vessel]
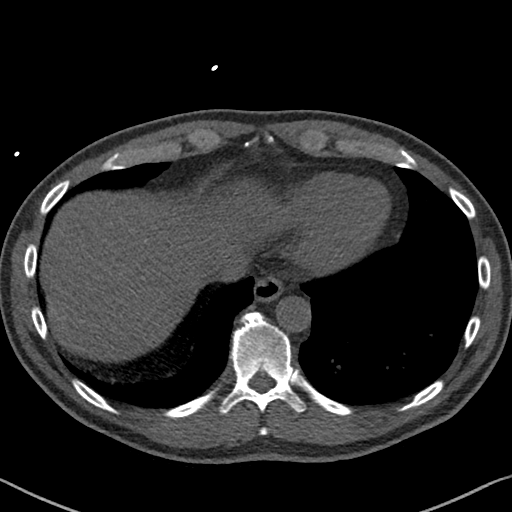
[im 19/55  vessel]
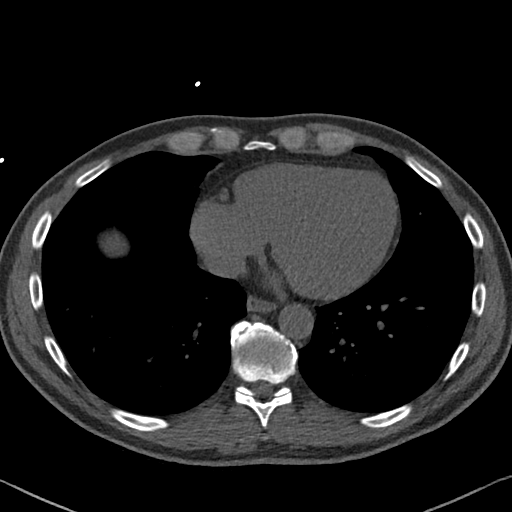
[im 25/55  vessel]
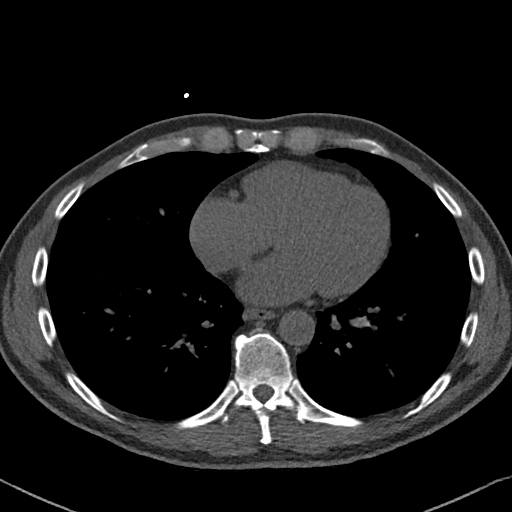
[im 31/55  vessel]
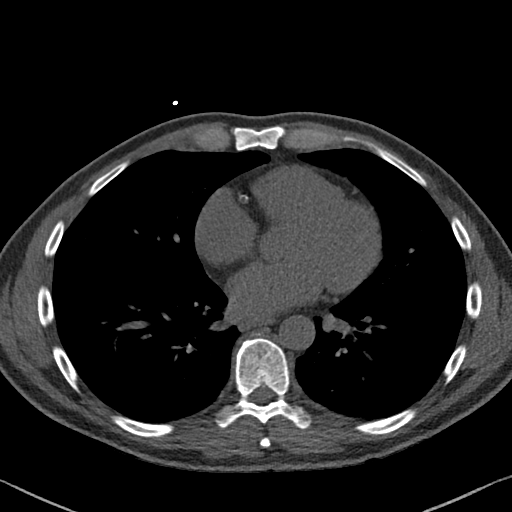
[im 31/55  lung]
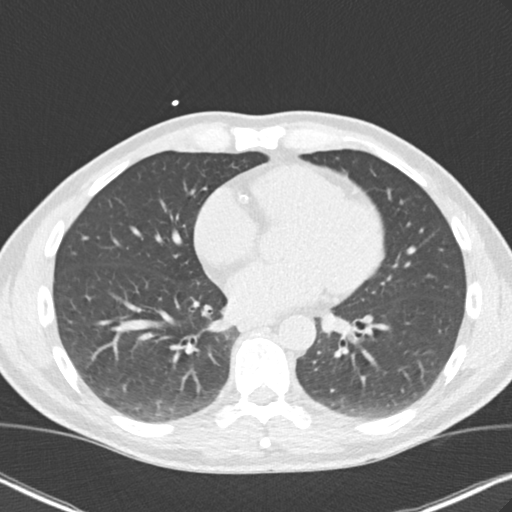
[im 37/55  vessel]
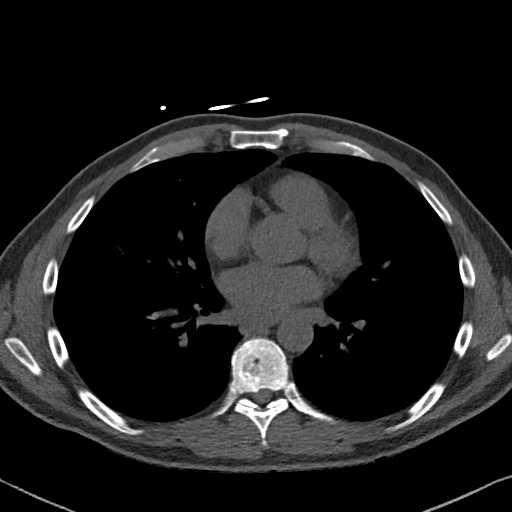
[im 43/55  vessel]
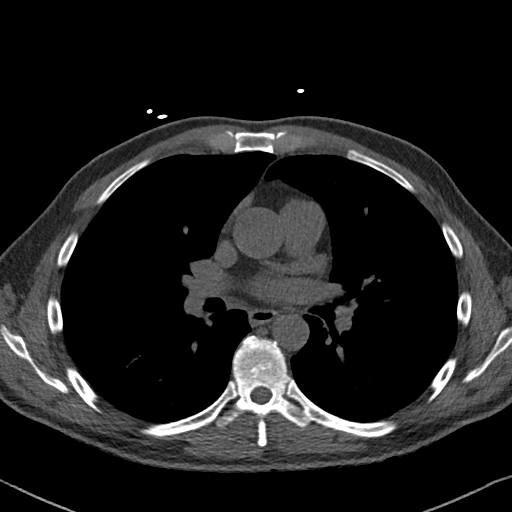
[im 49/55  vessel]
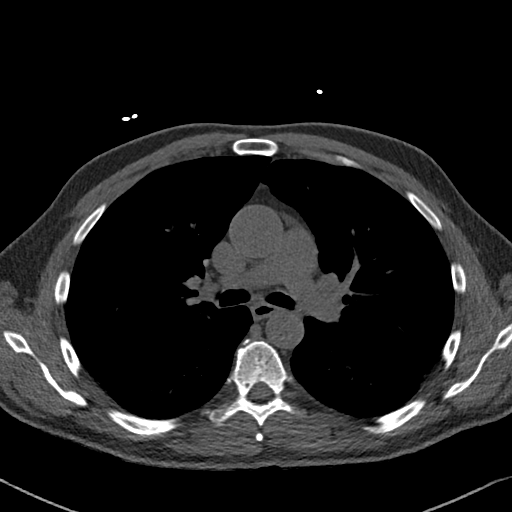

[Series 3: soft full fov 70 % · axial · 0.70mm/px · z∈[+1396,+1504]mm · 7 of 55 slices shown]
[im 7/55  vessel]
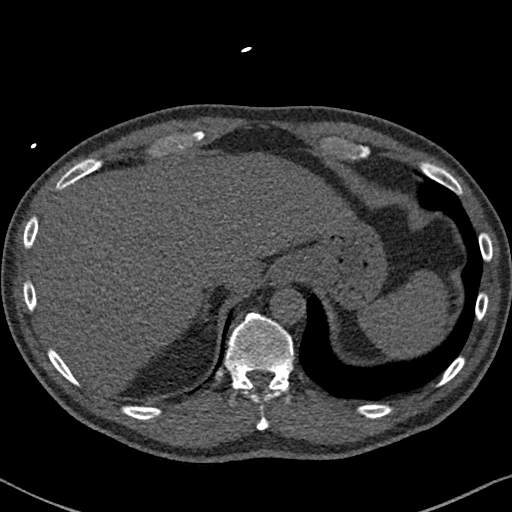
[im 13/55  vessel]
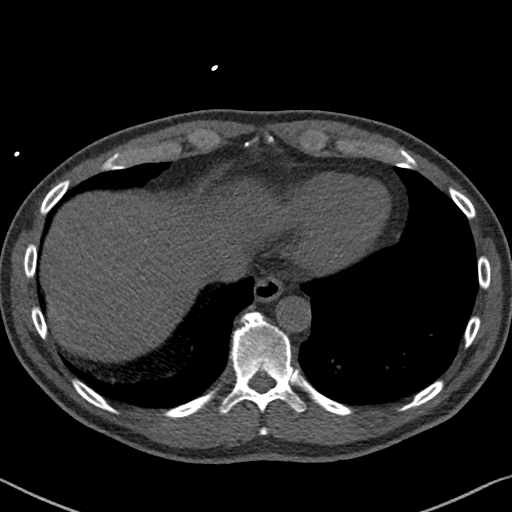
[im 19/55  vessel]
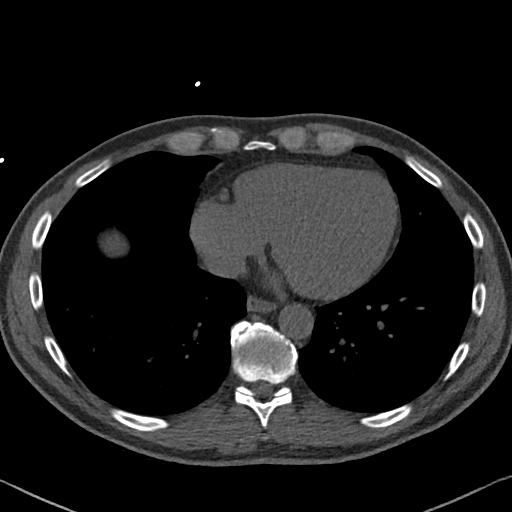
[im 25/55  vessel]
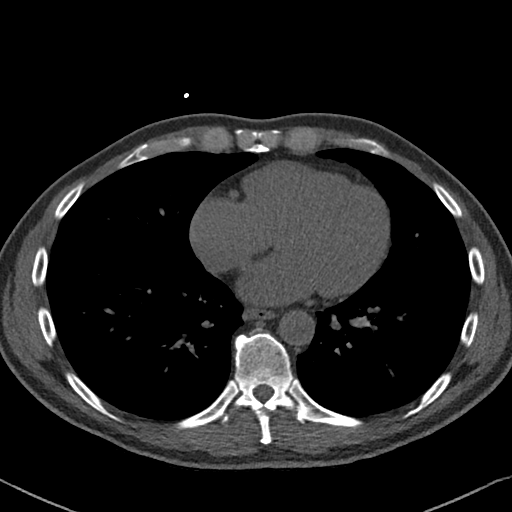
[im 31/55  vessel]
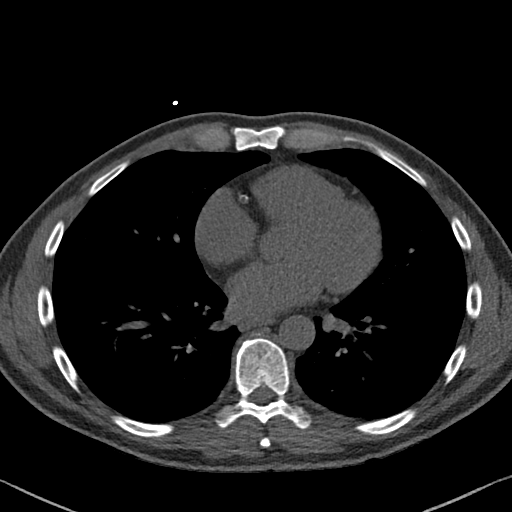
[im 37/55  vessel]
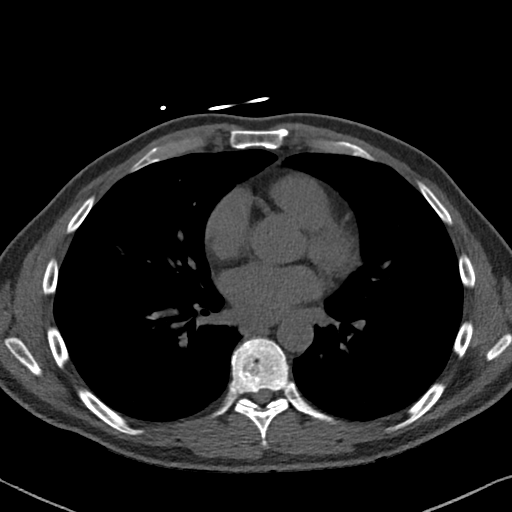
[im 43/55  vessel]
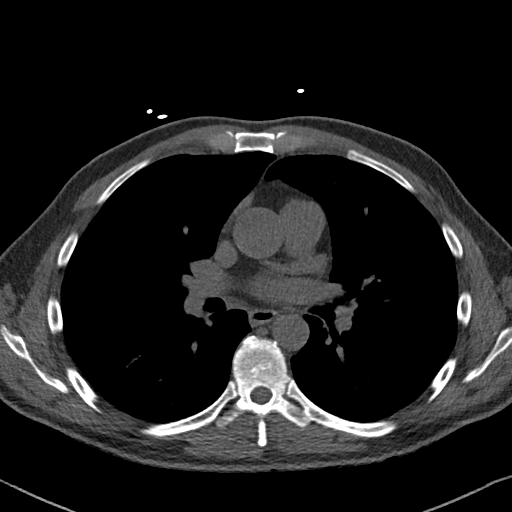

[15 of 20 positions shown; findings below may reference images not displayed]

FINDINGS: Within the visualized portions of the thorax there are no suspicious
appearing pulmonary nodules or masses, there is no acute
consolidative airspace disease, no pleural effusions, no
pneumothorax and no lymphadenopathy. Visualized portions of the
upper abdomen are unremarkable. There are no aggressive appearing
lytic or blastic lesions noted in the visualized portions of the
skeleton.
IMPRESSION: No significant incidental noncardiac findings are noted.
FINDINGS: Coronary arteries: Normal origins.

Coronary Calcium Score:

Left main: 0

Left anterior descending artery: 344

Left circumflex artery: 0

Right coronary artery: 168

Total: 512

Percentile: 77

Pericardium: Normal.

Aorta: Normal caliber of ascending aorta. Aortic atherosclerosis
noted.

Non-cardiac: See separate report from [REDACTED].
IMPRESSION: Coronary calcium score of 512. This was 77 percentile for age-,
race-, and sex-matched controls.



If CAC=0, it is reasonable to withhold statin therapy and reassess
in 5 to 10 years, as long as higher risk conditions are absent
(diabetes mellitus, family history of premature CHD in first degree
relatives (males <55 years; females <65 years), cigarette smoking,
or LDL >=190 mg/dL).

If CAC is 1 to 99, it is reasonable to initiate statin therapy for
patients >=55 years of age.

If CAC is >=100 or >=75th percentile, it is reasonable to initiate
statin therapy at any age.

Cardiology referral should be considered for patients with CAC
scores >=400 or >=75th percentile.

*7340 AHA/ACC/AACVPR/AAPA/ABC/RAMIAR/CHRISTIANE/WINKLER/Cyryl/ALEKSANDER/SHABNAM/MYLES
Guideline on the Management of Blood Cholesterol: A Report of the
American College of Cardiology/American Heart Association Task Force
on Clinical Practice Guidelines. J Am Coll Cardiol.
0676;73(24):8156-8899.

*** End of Addendum ***
EXAM:
OVER-READ INTERPRETATION  CT CHEST

The following report is an over-read performed by radiologist Dr.
Ellie-Mai Missen [REDACTED] on 01/07/2021. This
over-read does not include interpretation of cardiac or coronary
anatomy or pathology. The coronary calcium score interpretation by
the cardiologist is attached.
FINDINGS: Within the visualized portions of the thorax there are no suspicious
appearing pulmonary nodules or masses, there is no acute
consolidative airspace disease, no pleural effusions, no
pneumothorax and no lymphadenopathy. Visualized portions of the
upper abdomen are unremarkable. There are no aggressive appearing
lytic or blastic lesions noted in the visualized portions of the
skeleton.
IMPRESSION: No significant incidental noncardiac findings are noted.

## 2021-12-15 DIAGNOSIS — E663 Overweight: Secondary | ICD-10-CM | POA: Diagnosis not present

## 2021-12-15 DIAGNOSIS — E7849 Other hyperlipidemia: Secondary | ICD-10-CM | POA: Diagnosis not present

## 2021-12-15 DIAGNOSIS — R7309 Other abnormal glucose: Secondary | ICD-10-CM | POA: Diagnosis not present

## 2021-12-15 DIAGNOSIS — Z0000 Encounter for general adult medical examination without abnormal findings: Secondary | ICD-10-CM | POA: Diagnosis not present

## 2021-12-15 DIAGNOSIS — I10 Essential (primary) hypertension: Secondary | ICD-10-CM | POA: Diagnosis not present

## 2021-12-15 DIAGNOSIS — Z23 Encounter for immunization: Secondary | ICD-10-CM | POA: Diagnosis not present

## 2021-12-15 DIAGNOSIS — C61 Malignant neoplasm of prostate: Secondary | ICD-10-CM | POA: Diagnosis not present

## 2021-12-15 DIAGNOSIS — Z6826 Body mass index (BMI) 26.0-26.9, adult: Secondary | ICD-10-CM | POA: Diagnosis not present

## 2021-12-15 DIAGNOSIS — E782 Mixed hyperlipidemia: Secondary | ICD-10-CM | POA: Diagnosis not present

## 2021-12-22 ENCOUNTER — Other Ambulatory Visit (HOSPITAL_COMMUNITY): Payer: Self-pay

## 2021-12-22 DIAGNOSIS — N529 Male erectile dysfunction, unspecified: Secondary | ICD-10-CM | POA: Diagnosis not present

## 2021-12-22 DIAGNOSIS — C61 Malignant neoplasm of prostate: Secondary | ICD-10-CM | POA: Diagnosis not present

## 2021-12-22 DIAGNOSIS — N393 Stress incontinence (female) (male): Secondary | ICD-10-CM | POA: Diagnosis not present

## 2021-12-22 DIAGNOSIS — R3915 Urgency of urination: Secondary | ICD-10-CM | POA: Diagnosis not present

## 2021-12-22 MED ORDER — OXYBUTYNIN CHLORIDE 5 MG PO TABS
5.0000 mg | ORAL_TABLET | Freq: Three times a day (TID) | ORAL | 11 refills | Status: AC | PRN
  Filled 2021-12-22: qty 60, 20d supply, fill #0

## 2022-01-11 ENCOUNTER — Other Ambulatory Visit (HOSPITAL_COMMUNITY): Payer: Self-pay

## 2022-01-11 MED ORDER — OLMESARTAN MEDOXOMIL 20 MG PO TABS
20.0000 mg | ORAL_TABLET | Freq: Every day | ORAL | 3 refills | Status: DC
  Filled 2022-01-11: qty 90, 90d supply, fill #0
  Filled 2022-04-12: qty 90, 90d supply, fill #1
  Filled 2022-07-08: qty 90, 90d supply, fill #2
  Filled 2022-10-05: qty 90, 90d supply, fill #3

## 2022-02-08 ENCOUNTER — Other Ambulatory Visit (HOSPITAL_COMMUNITY): Payer: Self-pay

## 2022-02-08 MED ORDER — ROSUVASTATIN CALCIUM 10 MG PO TABS
10.0000 mg | ORAL_TABLET | Freq: Every day | ORAL | 3 refills | Status: DC
  Filled 2022-02-08: qty 90, 90d supply, fill #0
  Filled 2022-05-06: qty 90, 90d supply, fill #1
  Filled 2022-08-03: qty 90, 90d supply, fill #2
  Filled 2022-11-06: qty 90, 90d supply, fill #3

## 2022-04-13 ENCOUNTER — Other Ambulatory Visit (HOSPITAL_COMMUNITY): Payer: Self-pay

## 2022-04-14 DIAGNOSIS — L570 Actinic keratosis: Secondary | ICD-10-CM | POA: Diagnosis not present

## 2022-04-14 DIAGNOSIS — D225 Melanocytic nevi of trunk: Secondary | ICD-10-CM | POA: Diagnosis not present

## 2022-04-14 DIAGNOSIS — Z1283 Encounter for screening for malignant neoplasm of skin: Secondary | ICD-10-CM | POA: Diagnosis not present

## 2022-04-14 DIAGNOSIS — X32XXXA Exposure to sunlight, initial encounter: Secondary | ICD-10-CM | POA: Diagnosis not present

## 2022-04-14 DIAGNOSIS — L820 Inflamed seborrheic keratosis: Secondary | ICD-10-CM | POA: Diagnosis not present

## 2022-05-06 ENCOUNTER — Other Ambulatory Visit (HOSPITAL_COMMUNITY): Payer: Self-pay

## 2022-06-23 ENCOUNTER — Other Ambulatory Visit: Payer: Self-pay | Admitting: Urology

## 2022-06-23 ENCOUNTER — Other Ambulatory Visit: Payer: 59

## 2022-06-23 DIAGNOSIS — R972 Elevated prostate specific antigen [PSA]: Secondary | ICD-10-CM

## 2022-06-24 LAB — PSA: Prostate Specific Ag, Serum: <0.1 ng/mL (ref 0.0–4.0)

## 2022-06-29 DIAGNOSIS — R3915 Urgency of urination: Secondary | ICD-10-CM | POA: Diagnosis not present

## 2022-06-29 DIAGNOSIS — C61 Malignant neoplasm of prostate: Secondary | ICD-10-CM | POA: Diagnosis not present

## 2022-06-29 DIAGNOSIS — N393 Stress incontinence (female) (male): Secondary | ICD-10-CM | POA: Diagnosis not present

## 2022-07-08 ENCOUNTER — Other Ambulatory Visit (HOSPITAL_COMMUNITY): Payer: Self-pay

## 2022-08-04 ENCOUNTER — Other Ambulatory Visit (HOSPITAL_COMMUNITY): Payer: Self-pay

## 2022-09-09 ENCOUNTER — Other Ambulatory Visit (HOSPITAL_COMMUNITY): Payer: Self-pay

## 2022-10-06 ENCOUNTER — Other Ambulatory Visit (HOSPITAL_COMMUNITY): Payer: Self-pay

## 2022-11-02 ENCOUNTER — Other Ambulatory Visit (HOSPITAL_COMMUNITY): Payer: Self-pay

## 2022-11-06 ENCOUNTER — Other Ambulatory Visit (HOSPITAL_COMMUNITY): Payer: Self-pay

## 2022-11-25 ENCOUNTER — Other Ambulatory Visit (HOSPITAL_COMMUNITY): Payer: Self-pay

## 2023-01-01 ENCOUNTER — Other Ambulatory Visit (HOSPITAL_COMMUNITY): Payer: Self-pay

## 2023-01-03 ENCOUNTER — Other Ambulatory Visit: Payer: Self-pay

## 2023-01-03 ENCOUNTER — Other Ambulatory Visit (HOSPITAL_COMMUNITY): Payer: Self-pay

## 2023-01-03 MED ORDER — OLMESARTAN MEDOXOMIL 20 MG PO TABS
20.0000 mg | ORAL_TABLET | Freq: Every day | ORAL | 0 refills | Status: DC
  Filled 2023-01-03: qty 90, 90d supply, fill #0

## 2023-01-03 MED ORDER — ROSUVASTATIN CALCIUM 10 MG PO TABS
10.0000 mg | ORAL_TABLET | Freq: Every day | ORAL | 0 refills | Status: DC
  Filled 2023-01-03 – 2023-02-01 (×2): qty 90, 90d supply, fill #0

## 2023-01-26 ENCOUNTER — Other Ambulatory Visit (HOSPITAL_COMMUNITY): Payer: Self-pay | Admitting: Internal Medicine

## 2023-01-26 DIAGNOSIS — M5412 Radiculopathy, cervical region: Secondary | ICD-10-CM

## 2023-01-27 ENCOUNTER — Ambulatory Visit (HOSPITAL_COMMUNITY)
Admission: RE | Admit: 2023-01-27 | Discharge: 2023-01-27 | Disposition: A | Discharge status: Home / Self Care | Payer: 59 | Source: Ambulatory Visit | Attending: Internal Medicine | Admitting: Internal Medicine

## 2023-01-27 DIAGNOSIS — M47812 Spondylosis without myelopathy or radiculopathy, cervical region: Secondary | ICD-10-CM | POA: Diagnosis not present

## 2023-01-27 DIAGNOSIS — M5412 Radiculopathy, cervical region: Secondary | ICD-10-CM | POA: Diagnosis not present

## 2023-02-01 ENCOUNTER — Other Ambulatory Visit (HOSPITAL_COMMUNITY): Payer: Self-pay

## 2023-02-02 ENCOUNTER — Ambulatory Visit (HOSPITAL_COMMUNITY): Payer: Self-pay

## 2023-02-15 DIAGNOSIS — M4712 Other spondylosis with myelopathy, cervical region: Secondary | ICD-10-CM | POA: Diagnosis not present

## 2023-02-15 DIAGNOSIS — M542 Cervicalgia: Secondary | ICD-10-CM | POA: Diagnosis not present

## 2023-02-28 ENCOUNTER — Ambulatory Visit (HOSPITAL_COMMUNITY)
Admission: RE | Admit: 2023-02-28 | Discharge: 2023-02-28 | Disposition: A | Discharge status: Home / Self Care | Payer: 59 | Source: Ambulatory Visit | Attending: General Surgery | Admitting: General Surgery

## 2023-02-28 ENCOUNTER — Other Ambulatory Visit (INDEPENDENT_AMBULATORY_CARE_PROVIDER_SITE_OTHER): Payer: Self-pay | Admitting: General Surgery

## 2023-02-28 DIAGNOSIS — M79671 Pain in right foot: Secondary | ICD-10-CM | POA: Diagnosis not present

## 2023-02-28 NOTE — Progress Notes (Signed)
[facility]  Right foot pain and swelling. Xray ordered.   Algis Greenhouse, MD [facility] [address] [place] [place] [place], [place] [address] [phone] (office)

## 2023-03-04 NOTE — Progress Notes (Signed)
Notified patient of results 

## 2023-03-28 ENCOUNTER — Other Ambulatory Visit (HOSPITAL_COMMUNITY): Payer: Self-pay

## 2023-03-28 MED ORDER — ROSUVASTATIN CALCIUM 10 MG PO TABS
10.0000 mg | ORAL_TABLET | Freq: Every day | ORAL | 0 refills | Status: DC
  Filled 2023-03-28 – 2023-05-02 (×2): qty 90, 90d supply, fill #0

## 2023-03-28 MED ORDER — OLMESARTAN MEDOXOMIL 20 MG PO TABS
20.0000 mg | ORAL_TABLET | Freq: Every day | ORAL | 0 refills | Status: DC
  Filled 2023-03-28: qty 90, 90d supply, fill #0

## 2023-03-29 ENCOUNTER — Other Ambulatory Visit (HOSPITAL_COMMUNITY): Payer: Self-pay

## 2023-03-29 ENCOUNTER — Other Ambulatory Visit: Payer: Self-pay

## 2023-04-12 ENCOUNTER — Encounter (HOSPITAL_COMMUNITY): Payer: Self-pay | Admitting: Physical Therapy

## 2023-04-12 ENCOUNTER — Ambulatory Visit (HOSPITAL_COMMUNITY): Payer: 59 | Attending: Physical Medicine and Rehabilitation | Admitting: Physical Therapy

## 2023-04-12 ENCOUNTER — Other Ambulatory Visit: Payer: Self-pay

## 2023-04-12 DIAGNOSIS — M542 Cervicalgia: Secondary | ICD-10-CM | POA: Insufficient documentation

## 2023-04-12 DIAGNOSIS — R29898 Other symptoms and signs involving the musculoskeletal system: Secondary | ICD-10-CM | POA: Insufficient documentation

## 2023-04-12 NOTE — Therapy (Signed)
OUTPATIENT PHYSICAL THERAPY CERVICAL EVALUATION   Patient Name: [name] MRN: [id number] DOB:[birth year], 62 y.o., male Today's Date: 04/12/2023  END OF SESSION:  PT End of Session - 04/12/23 0730     Visit Number 1    Number of Visits 8    Date for PT Re-Evaluation 05/10/23    Authorization Type Primary: [employer] Secondary: generic [employer]    PT Start Time 0730    PT Stop Time [phone]    PT Time Calculation (min) 47 min    Activity Tolerance Patient tolerated treatment well    Behavior During Therapy [facility] for tasks assessed/performed             Past Medical History:  Diagnosis Date   Hyperlipidemia    Hypertension    Prostate cancer [facility])    Past Surgical History:  Procedure Laterality Date   BACK SURGERY  2010   COLONOSCOPY N/A 07/23/2016   Procedure: COLONOSCOPY;  Surgeon: Corbin Ade, MD;  Location: [facility];  Service: Endoscopy;  Laterality: N/A;  10:00 AM   LYMPHADENECTOMY Bilateral 09/10/2020   Procedure: LYMPHADENECTOMY;  Surgeon: Sebastian Ache, MD;  Location: [facility] ORS;  Service: Urology;  Laterality: Bilateral;   PROSTATE BIOPSY     ROBOT ASSISTED LAPAROSCOPIC RADICAL PROSTATECTOMY N/A 09/10/2020   Procedure: XI ROBOTIC ASSISTED LAPAROSCOPIC RADICAL PROSTATECTOMY;  Surgeon: Sebastian Ache, MD;  Location: [facility] ORS;  Service: Urology;  Laterality: N/A;  3 HRS   Patient Active Problem List   Diagnosis Date Noted   Acute foot pain, right 02/28/2023   Prostate cancer (HCC) 09/10/2020   Elevated PSA 06/17/2020    PCP: Assunta Found MD  REFERRING PROVIDER: Tressie Stalker, MD  REFERRING DIAG: M54.2 (ICD-10-CM) - Cervicalgia  THERAPY DIAG:  Cervicalgia  Other symptoms and signs involving the musculoskeletal system  Rationale for Evaluation and Treatment: Rehabilitation  ONSET DATE: Earlier this year  SUBJECTIVE:                                                                                                                                                                                                          SUBJECTIVE STATEMENT: Patient states neck pain and numbness that began earlier this year. Had shooting pain down back of arm from neck. He notes decreased grip strength in L hand and decreased dexterity while tying sutures. Maybe a little in R hand on strength loss. Symptoms follow a C8 dermatome in LUE. Symptoms began with insidious onset. Neck pain intermittent but neuropathy symptoms present in L arm C8 dermatome  pretty constant. Started using robots for surgery in November 2023 when necessary/able. No L shoulder injury.   PERTINENT HISTORY:  Hx prostate cancer, HTN, HLD  PAIN:  Are you having pain? Yes: NPRS scale: 4/10 Pain location: neck and into LUE Pain description: achy Aggravating factors: intermittent Relieving factors: none  PRECAUTIONS: None  WEIGHT BEARING RESTRICTIONS: No  FALLS:  Has patient fallen in last 6 months? No   OCCUPATION: Surgeon  PLOF: Independent  PATIENT GOALS: get PT eval   OBJECTIVE:   DIAGNOSTIC FINDINGS:  MRI 01/27/23 IMPRESSION: 1. Cervical spondylosis as outlined and with findings most notably as follows. 2. At C7-T1, left-sided disc osteophyte ridge contributes to severe left neural foraminal narrowing. 3. At C6-C7, there is moderate-to-advanced disc degeneration (with mild degenerative endplate edema). Multifactorial mild-to-moderate spinal canal stenosis. Severe bilateral neural foraminal narrowing. 4. At C5-C6, there is moderate-to-advanced disc degeneration (with mild degenerative endplate edema). A posterior disc osteophyte complex effaces the ventral thecal sac, mildly flattening the ventral aspect of the spinal cord. However, the dorsal CSF space is maintained within the spinal canal. Multifactorial bilateral neural foraminal narrowing (severe right, moderate/severe left). 5. At C3-C4, a posterior disc osteophyte complex and superimposed central  disc protrusion efface the ventral thecal sac, mildly flattening the ventral aspect of the spinal cord. However, the dorsal CSF space is maintained within the spinal canal. Multifactorial bilateral neural foraminal narrowing also present at this level (severe right, moderate left).  PATIENT SURVEYS: FOTO complete next session- time limited  COGNITION: Overall cognitive status: Within functional limits for tasks assessed  SENSATION:Light touch: decreased L - C8  POSTURE: rounded shoulders, forward head, and increased thoracic kyphosis  PALPATION: TTP grossly cervical paraspinals R>L, hypomobile R and L UPA  throughout c/sp, trigger point L UT, hypomobile 1st rib bilaterally L>R with symptoms into L UE   CERVICAL ROM:   Active ROM A/PROM (deg) eval  Flexion 50  Extension 30 *  Right lateral flexion 12  Left lateral flexion 15  Right rotation 29  Left rotation 34   (Blank rows = not tested) *=symptoms  UPPER EXTREMITY ROM: WFL for tasks assessed    UPPER EXTREMITY MMT:  MMT Right eval Left eval  Shoulder flexion 5 4  Shoulder extension    Shoulder abduction 5 4  Shoulder adduction    Shoulder extension    Shoulder internal rotation 5 5  Shoulder external rotation 5 4+  Middle trapezius    Lower trapezius    Elbow flexion 5 5  Elbow extension 5 5  Wrist flexion    Wrist extension    Wrist ulnar deviation    Wrist radial deviation    Wrist pronation    Wrist supination    Grip strength WFL decreased   (Blank rows = not tested)  CERVICAL SPECIAL TESTS:  Spurling's test: Positive and Distraction test: Negative    TODAY'S TREATMENT:  DATE:  04/12/23 Supine cervical retractation 2x 10   Seated cervical retractions 2 x 10  Seated scap retractions 2 x 10 Doorway pec stretch 3 x 30 second holds  PATIENT EDUCATION:  Education details:  Patient educated on exam findings, POC, scope of PT, HEP, and DN, posture. Person educated: Patient Education method: Explanation, Demonstration, and Handouts Education comprehension: verbalized understanding, returned demonstration, verbal cues required, and tactile cues required  HOME EXERCISE PROGRAM: Access Code: [email or web address] URL: [email or web address]  Date: 04/12/2023 - Supine Chin Tuck  - 2-3 x daily - 7 x weekly - 2 sets - 10 reps - 2-3 second hold - Seated Cervical Retraction  - 4-5 x daily - 7 x weekly - 2 sets - 10 reps - Seated Scapular Retraction  - 4-5 x daily - 7 x weekly - 2 sets - 10 reps - Doorway Pec Stretch at 60 Elevation  - 3 x daily - 7 x weekly - 3 reps - 30 second hold  ASSESSMENT:  CLINICAL IMPRESSION: Patient a 62 y.o. y.o. male who was seen today for physical therapy evaluation and treatment for cervicalgia, with possible contribution of T.O.S.. Patient presents with pain limited deficits in cervical spine and LUE strength, ROM, endurance, activity tolerance, posture, sensation, hyperactive and tender musculature, and functional mobility with ADL. Patient is having to modify and restrict ADL as indicated by outcome measure score as well as subjective information and objective measures which is affecting overall participation. Patient will benefit from skilled physical therapy in order to improve function and reduce impairment.  OBJECTIVE IMPAIRMENTS: decreased activity tolerance, decreased endurance, decreased mobility, decreased ROM, decreased strength, hypomobility, increased muscle spasms, impaired flexibility, improper body mechanics, postural dysfunction, and pain.   ACTIVITY LIMITATIONS: carrying, lifting, bending, and caring for others  PARTICIPATION LIMITATIONS: meal prep, cleaning, community activity, occupation, and yard work  PERSONAL FACTORS: Profession and Time since onset of injury/illness/exacerbation are also affecting patient's  functional outcome.   REHAB POTENTIAL: Good  CLINICAL DECISION MAKING: Stable/uncomplicated  EVALUATION COMPLEXITY: Low   GOALS: Goals reviewed with patient? Yes  SHORT TERM GOALS: Target date: 04/26/2023    Patient will be independent with HEP in order to improve functional outcomes. Baseline: Goal status: INITIAL  2.  Patient will report at least 25% improvement in symptoms for improved quality of life. Baseline:  Goal status: INITIAL    LONG TERM GOALS: Target date: 05/10/2023    Patient will report at least 75% improvement in symptoms for improved quality of life. Baseline:  Goal status: INITIAL  2.  Patient will improve FOTO score to predicted outcomes in order to indicate improved tolerance to activity. Baseline:  Goal status: INITIAL  3.  Patient will demonstrate at least 25% improvement in cervical ROM in all restricted planes for improved ability to move head while working. Baseline: see above Goal status: INITIAL  4.  Patient will report centralized cervical symptoms no greater than 2/10 for improved ability to use L hand at home and work.  Baseline: current 4/10 Goal status: INITIAL  5.  Patient will demonstrate grade of 5/5 MMT grade in all tested musculature as evidence of improved strength to assist with lifting and gripping. Baseline: see above Goal status: INITIAL    PLAN:  PT FREQUENCY: 1-2x/week  PT DURATION: 4 weeks  PLANNED INTERVENTIONS: Therapeutic exercises, Therapeutic activity, Neuromuscular re-education, Balance training, Gait training, Patient/Family education, Joint manipulation, Joint mobilization, Stair training, Orthotic/Fit training, DME instructions, Aquatic Therapy, Dry Needling, Electrical stimulation, Spinal manipulation, Spinal mobilization,  Cryotherapy, Moist heat, Compression bandaging, scar mobilization, Splintting, Taping, Traction, Ultrasound, Ionotophoresis 4mg /ml Dexamethasone, and Manual therapy  PLAN FOR NEXT  SESSION: f/u with HEP; possibly DN, manual cervical/thoracic spine/ ribs for mobility/symptoms; postural strength, L shoulder strength, grip strength; spinal mobility   Wyman Songster, PT 04/12/2023, 8:17 AM

## 2023-04-18 ENCOUNTER — Ambulatory Visit (HOSPITAL_COMMUNITY): Payer: 59 | Admitting: Physical Therapy

## 2023-04-18 ENCOUNTER — Encounter (HOSPITAL_COMMUNITY): Payer: Self-pay | Admitting: Physical Therapy

## 2023-04-18 DIAGNOSIS — M542 Cervicalgia: Secondary | ICD-10-CM

## 2023-04-18 DIAGNOSIS — R29898 Other symptoms and signs involving the musculoskeletal system: Secondary | ICD-10-CM | POA: Diagnosis not present

## 2023-04-18 NOTE — Patient Instructions (Signed)

## 2023-04-18 NOTE — Therapy (Signed)
OUTPATIENT PHYSICAL THERAPY TREATMENT   Patient Name: [name] MRN: [id number] DOB:January 18, 1961, 62 y.o., male Today's Date: 04/18/2023  END OF SESSION:  PT End of Session - 04/18/23 0729     Visit Number 2    Number of Visits 8    Date for PT Re-Evaluation 05/10/23    Authorization Type Primary: [employer] Secondary: generic [employer]    PT Start Time 0730    PT Stop Time 0818    PT Time Calculation (min) 48 min    Activity Tolerance Patient tolerated treatment well    Behavior During Therapy [facility] for tasks assessed/performed             Past Medical History:  Diagnosis Date   Hyperlipidemia    Hypertension    Prostate cancer [facility])    Past Surgical History:  Procedure Laterality Date   BACK SURGERY  2010   COLONOSCOPY N/A 07/23/2016   Procedure: COLONOSCOPY;  Surgeon: Corbin Ade, MD;  Location: [facility];  Service: Endoscopy;  Laterality: N/A;  10:00 AM   LYMPHADENECTOMY Bilateral 09/10/2020   Procedure: LYMPHADENECTOMY;  Surgeon: Sebastian Ache, MD;  Location: [facility];  Service: Urology;  Laterality: Bilateral;   PROSTATE BIOPSY     ROBOT ASSISTED LAPAROSCOPIC RADICAL PROSTATECTOMY N/A 09/10/2020   Procedure: XI ROBOTIC ASSISTED LAPAROSCOPIC RADICAL PROSTATECTOMY;  Surgeon: Sebastian Ache, MD;  Location: [facility];  Service: Urology;  Laterality: N/A;  3 HRS   Patient Active Problem List   Diagnosis Date Noted   Acute foot pain, right 02/28/2023   Prostate cancer (HCC) 09/10/2020   Elevated PSA 06/17/2020    PCP: Assunta Found MD  REFERRING PROVIDER: Tressie Stalker, MD  REFERRING DIAG: M54.2 (ICD-10-CM) - Cervicalgia  THERAPY DIAG:  Cervicalgia  Other symptoms and signs involving the musculoskeletal system  Rationale for Evaluation and Treatment: Rehabilitation  ONSET DATE: Earlier this year  SUBJECTIVE:                                                                                                                                                                                                          SUBJECTIVE STATEMENT: Patient states the cervical retraction made his neck sore. L arm has been more sore. Notices some clicking/catching in the neck. Things have been more constant sore.   EVAL: Patient states neck pain and numbness that began earlier this year. Had shooting pain down back of arm from neck. He notes decreased grip strength in L hand and decreased dexterity while tying sutures. Maybe a little in  R hand on strength loss. Symptoms follow a C8 dermatome in LUE. Symptoms began with insidious onset. Neck pain intermittent but neuropathy symptoms present in L arm C8 dermatome pretty constant. Started using robots for surgery in November 2023 when necessary/able. No L shoulder injury.   PERTINENT HISTORY:  Hx prostate cancer, HTN, HLD  PAIN:  Are you having pain? Yes: NPRS scale: 6/10 Pain location: neck and into LUE Pain description: achy Aggravating factors: intermittent Relieving factors: none  PRECAUTIONS: None  WEIGHT BEARING RESTRICTIONS: No  FALLS:  Has patient fallen in last 6 months? No   OCCUPATION: Surgeon  PLOF: Independent  PATIENT GOALS: get PT eval   OBJECTIVE:   DIAGNOSTIC FINDINGS:  MRI 01/27/23 IMPRESSION: 1. Cervical spondylosis as outlined and with findings most notably as follows. 2. At C7-T1, left-sided disc osteophyte ridge contributes to severe left neural foraminal narrowing. 3. At C6-C7, there is moderate-to-advanced disc degeneration (with mild degenerative endplate edema). Multifactorial mild-to-moderate spinal canal stenosis. Severe bilateral neural foraminal narrowing. 4. At C5-C6, there is moderate-to-advanced disc degeneration (with mild degenerative endplate edema). A posterior disc osteophyte complex effaces the ventral thecal sac, mildly flattening the ventral aspect of the spinal cord. However, the dorsal CSF space is maintained within the spinal canal.  Multifactorial bilateral neural foraminal narrowing (severe right, moderate/severe left). 5. At C3-C4, a posterior disc osteophyte complex and superimposed central disc protrusion efface the ventral thecal sac, mildly flattening the ventral aspect of the spinal cord. However, the dorsal CSF space is maintained within the spinal canal. Multifactorial bilateral neural foraminal narrowing also present at this level (severe right, moderate left).  PATIENT SURVEYS: FOTO complete next session- time limited  COGNITION: Overall cognitive status: Within functional limits for tasks assessed  SENSATION:Light touch: decreased L - C8  POSTURE: rounded shoulders, forward head, and increased thoracic kyphosis  PALPATION: TTP grossly cervical paraspinals R>L, hypomobile R and L UPA  throughout c/sp, trigger point L UT, hypomobile 1st rib bilaterally L>R with symptoms into L UE   CERVICAL ROM:   Active ROM A/PROM (deg) eval  Flexion 50  Extension 30 *  Right lateral flexion 12  Left lateral flexion 15  Right rotation 29  Left rotation 34   (Blank rows = not tested) *=symptoms  UPPER EXTREMITY ROM: WFL for tasks assessed    UPPER EXTREMITY MMT:  MMT Right eval Left eval  Shoulder flexion 5 4  Shoulder extension    Shoulder abduction 5 4  Shoulder adduction    Shoulder extension    Shoulder internal rotation 5 5  Shoulder external rotation 5 4+  Middle trapezius    Lower trapezius    Elbow flexion 5 5  Elbow extension 5 5  Wrist flexion    Wrist extension    Wrist ulnar deviation    Wrist radial deviation    Wrist pronation    Wrist supination    Grip strength WFL decreased   (Blank rows = not tested)  CERVICAL SPECIAL TESTS:  Spurling's test: Positive and Distraction test: Negative    TODAY'S TREATMENT:  DATE:  04/18/23 Seated cervical  retractions 2 x 10  Seated scap retractions 2 x 10 Doorway pec stretch 3 x 30 second holds Supine cervical retractation 2x 10   Manual: R and L UPA grade III to mid/lower cervical spine, STM L UT, L first rib inferior glides grade III  Manual: STM to L UT pre and post dry needling for trigger point identification and muscular relaxation.  Trigger Point Dry-Needling  Treatment instructions: Expect mild to moderate muscle soreness. S/S of pneumothorax if dry needled over a lung field, and to seek immediate medical attention should they occur. Patient verbalized understanding of these instructions and education.  Patient Consent Given: Yes Education handout provided: Yes Muscles treated: L UT Electrical stimulation performed: No Parameters: N/A Treatment response/outcome: twitch response elicited  Seated UT stretch 3 x 30 second holds L UT Scapular retraction with GH ER RTB 2 x 10 Standing row RTB 2 x 10   04/12/23 Supine cervical retractation 2x 10   Seated cervical retractions 2 x 10  Seated scap retractions 2 x 10 Doorway pec stretch 3 x 30 second holds  PATIENT EDUCATION:  Education details: 04/18/23: HEP, DN;  EVAL: Patient educated on exam findings, POC, scope of PT, HEP, and DN, posture. Person educated: Patient Education method: Explanation, Demonstration, and Handouts Education comprehension: verbalized understanding, returned demonstration, verbal cues required, and tactile cues required  HOME EXERCISE PROGRAM: Access Code: [email or web address] URL: [email or web address]  04/18/23 - Shoulder External Rotation and Scapular Retraction with Resistance  - 1 x daily - 7 x weekly - 2 sets - 10 reps - Standing Shoulder Row with Anchored Resistance  - 1 x daily - 7 x weekly - 2 sets - 10 reps  Date: 04/12/2023 - Supine Chin Tuck  - 2-3 x daily - 7 x weekly - 2 sets - 10 reps - 2-3 second hold - Seated Cervical Retraction  - 4-5 x daily - 7 x weekly - 2 sets - 10 reps -  Seated Scapular Retraction  - 4-5 x daily - 7 x weekly - 2 sets - 10 reps - Doorway Pec Stretch at 60 Elevation  - 3 x daily - 7 x weekly - 3 reps - 30 second hold  ASSESSMENT:  CLINICAL IMPRESSION: Patient with continued symptoms and aggravated with cervical retractions, minimal cueing for mechanics and positioning with good carry over although kyphotic thoracic spine limits posture/mechanics. Patient with hypomobile cervical spine, 1st rib, and tender musculature with trigger points in L UT. Decrease in tissue tension with mobilizations, STM but continued symptoms present. 1st rib mobs not tolerated well due to pain at first rib. Performed DN to L UT with reproduction of symptoms and twitch response elicited but c/o increased UT and radicular symptoms following. Additional postural strengthening performed well. Patient will continue to benefit from physical therapy in order to improve function and reduce impairment.   OBJECTIVE IMPAIRMENTS: decreased activity tolerance, decreased endurance, decreased mobility, decreased ROM, decreased strength, hypomobility, increased muscle spasms, impaired flexibility, improper body mechanics, postural dysfunction, and pain.   ACTIVITY LIMITATIONS: carrying, lifting, bending, and caring for others  PARTICIPATION LIMITATIONS: meal prep, cleaning, community activity, occupation, and yard work  PERSONAL FACTORS: Profession and Time since onset of injury/illness/exacerbation are also affecting patient's functional outcome.   REHAB POTENTIAL: Good  CLINICAL DECISION MAKING: Stable/uncomplicated  EVALUATION COMPLEXITY: Low   GOALS: Goals reviewed with patient? Yes  SHORT TERM GOALS: Target date: 04/26/2023    Patient will be independent with HEP in  order to improve functional outcomes. Baseline: Goal status: INITIAL  2.  Patient will report at least 25% improvement in symptoms for improved quality of life. Baseline:  Goal status: INITIAL    LONG  TERM GOALS: Target date: 05/10/2023    Patient will report at least 75% improvement in symptoms for improved quality of life. Baseline:  Goal status: INITIAL  2.  Patient will improve FOTO score to predicted outcomes in order to indicate improved tolerance to activity. Baseline:  Goal status: INITIAL  3.  Patient will demonstrate at least 25% improvement in cervical ROM in all restricted planes for improved ability to move head while working. Baseline: see above Goal status: INITIAL  4.  Patient will report centralized cervical symptoms no greater than 2/10 for improved ability to use L hand at home and work.  Baseline: current 4/10 Goal status: INITIAL  5.  Patient will demonstrate grade of 5/5 MMT grade in all tested musculature as evidence of improved strength to assist with lifting and gripping. Baseline: see above Goal status: INITIAL    PLAN:  PT FREQUENCY: 1-2x/week  PT DURATION: 4 weeks  PLANNED INTERVENTIONS: Therapeutic exercises, Therapeutic activity, Neuromuscular re-education, Balance training, Gait training, Patient/Family education, Joint manipulation, Joint mobilization, Stair training, Orthotic/Fit training, DME instructions, Aquatic Therapy, Dry Needling, Electrical stimulation, Spinal manipulation, Spinal mobilization, Cryotherapy, Moist heat, Compression bandaging, scar mobilization, Splintting, Taping, Traction, Ultrasound, Ionotophoresis 4mg /ml Dexamethasone, and Manual therapy  PLAN FOR NEXT SESSION: f/u with HEP; possibly DN, manual cervical/thoracic spine/ ribs for mobility/symptoms; postural strength, L shoulder strength, grip strength; spinal mobility   Wyman Songster, PT 04/18/2023, 8:41 AM

## 2023-05-02 ENCOUNTER — Other Ambulatory Visit: Payer: Self-pay

## 2023-05-02 ENCOUNTER — Other Ambulatory Visit (HOSPITAL_COMMUNITY): Payer: Self-pay

## 2023-05-04 ENCOUNTER — Encounter (HOSPITAL_COMMUNITY): Payer: 59 | Admitting: Physical Therapy

## 2023-05-04 ENCOUNTER — Ambulatory Visit (HOSPITAL_COMMUNITY): Payer: 59 | Attending: Physical Medicine and Rehabilitation | Admitting: Physical Therapy

## 2023-05-04 ENCOUNTER — Encounter (HOSPITAL_COMMUNITY): Payer: Self-pay | Admitting: Physical Therapy

## 2023-05-04 DIAGNOSIS — R29898 Other symptoms and signs involving the musculoskeletal system: Secondary | ICD-10-CM | POA: Diagnosis not present

## 2023-05-04 DIAGNOSIS — M542 Cervicalgia: Secondary | ICD-10-CM | POA: Diagnosis not present

## 2023-05-04 NOTE — Therapy (Signed)
OUTPATIENT PHYSICAL THERAPY TREATMENT   Patient Name: [name] MRN: [id number] DOB:1961/01/28, 62 y.o., male Today's Date: 05/04/2023  END OF SESSION:  PT End of Session - 05/04/23 1118     Visit Number 3    Number of Visits 8    Date for PT Re-Evaluation 05/10/23    Authorization Type Primary: [employer] Secondary: generic [employer]na    PT Start Time 1118    PT Stop Time 1158    PT Time Calculation (min) 40 min    Activity Tolerance Patient tolerated treatment well    Behavior During Therapy WFL for tasks assessed/performed             Past Medical History:  Diagnosis Date   Hyperlipidemia    Hypertension    Prostate cancer [facility])    Past Surgical History:  Procedure Laterality Date   BACK SURGERY  2010   COLONOSCOPY N/A 07/23/2016   Procedure: COLONOSCOPY;  Surgeon: Corbin Ade, MD;  [facility];  Service: Endoscopy;  Laterality: N/A;  10:00 AM   LYMPHADENECTOMY Bilateral 09/10/2020   Procedure: LYMPHADENECTOMY;  Surgeon: Sebastian Ache, MD;  Location: [facility];  Service: Urology;  Laterality: Bilateral;   PROSTATE BIOPSY     ROBOT ASSISTED LAPAROSCOPIC RADICAL PROSTATECTOMY N/A 09/10/2020   Procedure: XI ROBOTIC ASSISTED LAPAROSCOPIC RADICAL PROSTATECTOMY;  Surgeon: Sebastian Ache, MD;  Location: [facility];  Service: Urology;  Laterality: N/A;  3 HRS   Patient Active Problem List   Diagnosis Date Noted   Acute foot pain, right 02/28/2023   Prostate cancer (HCC) 09/10/2020   Elevated PSA 06/17/2020    PCP: Assunta Found MD  REFERRING PROVIDER: Tressie Stalker, MD  REFERRING DIAG: M54.2 (ICD-10-CM) - Cervicalgia  THERAPY DIAG:  Cervicalgia  Other symptoms and signs involving the musculoskeletal system  Rationale for Evaluation and Treatment: Rehabilitation  ONSET DATE: Earlier this year  SUBJECTIVE:                                                                                                                                                                                                          SUBJECTIVE STATEMENT: Patient states exercises are going alright, not noticing much difference. Things are not worse. Sore in the upper trap from DN for 3 days. Neck pain/symptoms into L UT, tricep, hand in ulnar area.  EVAL: Patient states neck pain and numbness that began earlier this year. Had shooting pain down back of arm from neck. He notes decreased grip strength in L hand and decreased dexterity while  tying sutures. Maybe a little in R hand on strength loss. Symptoms follow a C8 dermatome in LUE. Symptoms began with insidious onset. Neck pain intermittent but neuropathy symptoms present in L arm C8 dermatome pretty constant. Started using robots for surgery in November 2023 when necessary/able. No L shoulder injury.   PERTINENT HISTORY:  Hx prostate cancer, HTN, HLD  PAIN:  Are you having pain? Yes: NPRS scale: 3-4/10 Pain location: neck and into LUE Pain description: achy Aggravating factors: intermittent Relieving factors: none  PRECAUTIONS: None  WEIGHT BEARING RESTRICTIONS: No  FALLS:  Has patient fallen in last 6 months? No   OCCUPATION: Surgeon  PLOF: Independent  PATIENT GOALS: get PT eval   OBJECTIVE:   DIAGNOSTIC FINDINGS:  MRI 01/27/23 IMPRESSION: 1. Cervical spondylosis as outlined and with findings most notably as follows. 2. At C7-T1, left-sided disc osteophyte ridge contributes to severe left neural foraminal narrowing. 3. At C6-C7, there is moderate-to-advanced disc degeneration (with mild degenerative endplate edema). Multifactorial mild-to-moderate spinal canal stenosis. Severe bilateral neural foraminal narrowing. 4. At C5-C6, there is moderate-to-advanced disc degeneration (with mild degenerative endplate edema). A posterior disc osteophyte complex effaces the ventral thecal sac, mildly flattening the ventral aspect of the spinal cord. However, the dorsal CSF space  is maintained within the spinal canal. Multifactorial bilateral neural foraminal narrowing (severe right, moderate/severe left). 5. At C3-C4, a posterior disc osteophyte complex and superimposed central disc protrusion efface the ventral thecal sac, mildly flattening the ventral aspect of the spinal cord. However, the dorsal CSF space is maintained within the spinal canal. Multifactorial bilateral neural foraminal narrowing also present at this level (severe right, moderate left).  PATIENT SURVEYS: FOTO complete next session- time limited  COGNITION: Overall cognitive status: Within functional limits for tasks assessed  SENSATION:Light touch: decreased L - C8  POSTURE: rounded shoulders, forward head, and increased thoracic kyphosis  PALPATION: TTP grossly cervical paraspinals R>L, hypomobile R and L UPA  throughout c/sp, trigger point L UT, hypomobile 1st rib bilaterally L>R with symptoms into L UE   CERVICAL ROM:   Active ROM A/PROM (deg) eval  Flexion 50  Extension 30 *  Right lateral flexion 12  Left lateral flexion 15  Right rotation 29  Left rotation 34   (Blank rows = not tested) *=symptoms  UPPER EXTREMITY ROM: WFL for tasks assessed    UPPER EXTREMITY MMT:  MMT Right eval Left eval  Shoulder flexion 5 4  Shoulder extension    Shoulder abduction 5 4  Shoulder adduction    Shoulder extension    Shoulder internal rotation 5 5  Shoulder external rotation 5 4+  Middle trapezius    Lower trapezius    Elbow flexion 5 5  Elbow extension 5 5  Wrist flexion    Wrist extension    Wrist ulnar deviation    Wrist radial deviation    Wrist pronation    Wrist supination    Grip strength WFL decreased   (Blank rows = not tested)  CERVICAL SPECIAL TESTS:  Spurling's test: Positive and Distraction test: Negative    TODAY'S TREATMENT:  DATE:  05/04/23 Manual: supine cervical retraction x 30, ret-ext x 5 Thoracic extension over chair 10 x 5 second holds Scapular retraction with GH ER RTB 2 x 10 Standing horizontal abduction GTB 2x10 Standing shoulder PNF D2 GTB 2 x 10  04/18/23 Seated cervical retractions 2 x 10  Seated scap retractions 2 x 10 Doorway pec stretch 3 x 30 second holds Supine cervical retractation 2x 10   Manual: R and L UPA grade III to mid/lower cervical spine, STM L UT, L first rib inferior glides grade III  Manual: STM to L UT pre and post dry needling for trigger point identification and muscular relaxation.  Trigger Point Dry-Needling  Treatment instructions: Expect mild to moderate muscle soreness. S/S of pneumothorax if dry needled over a lung field, and to seek immediate medical attention should they occur. Patient verbalized understanding of these instructions and education.  Patient Consent Given: Yes Education handout provided: Yes Muscles treated: L UT Electrical stimulation performed: No Parameters: N/A Treatment response/outcome: twitch response elicited  Seated UT stretch 3 x 30 second holds L UT Scapular retraction with GH ER RTB 2 x 10 Standing row RTB 2 x 10   04/12/23 Supine cervical retractation 2x 10   Seated cervical retractions 2 x 10  Seated scap retractions 2 x 10 Doorway pec stretch 3 x 30 second holds  PATIENT EDUCATION:  Education details: 04/18/23: HEP, DN;  EVAL: Patient educated on exam findings, POC, scope of PT, HEP, and DN, posture. Person educated: Patient Education method: Explanation, Demonstration, and Handouts Education comprehension: verbalized understanding, returned demonstration, verbal cues required, and tactile cues required  HOME EXERCISE PROGRAM: Access Code: [email or web address] URL: [email or web address]  05/04/23- Standing Shoulder Horizontal Abduction with Resistance  - 1 x daily - 7 x weekly - 2 sets - 10 reps - Standing Shoulder Single  Arm PNF D2 Flexion with Resistance (Mirrored)  - 1 x daily - 7 x weekly - 2 sets - 10 reps  04/18/23 - Shoulder External Rotation and Scapular Retraction with Resistance  - 1 x daily - 7 x weekly - 2 sets - 10 reps - Standing Shoulder Row with Anchored Resistance  - 1 x daily - 7 x weekly - 2 sets - 10 reps  Date: 04/12/2023 - Supine Chin Tuck  - 2-3 x daily - 7 x weekly - 2 sets - 10 reps - 2-3 second hold - Seated Cervical Retraction  - 4-5 x daily - 7 x weekly - 2 sets - 10 reps - Seated Scapular Retraction  - 4-5 x daily - 7 x weekly - 2 sets - 10 reps - Doorway Pec Stretch at 60 Elevation  - 3 x daily - 7 x weekly - 3 reps - 30 second hold  ASSESSMENT:  CLINICAL IMPRESSION: Patient with minimal to no change in symptoms since beginning PT with interventions performed. Continued to work on postural and UE strength as deficits remain. Patient demonstrating improving strength as he tolerates additional resistance. Patient with increase in symptoms following manual retractions/ ret-ext. Patient will continue to benefit from physical therapy in order to improve function and reduce impairment.   OBJECTIVE IMPAIRMENTS: decreased activity tolerance, decreased endurance, decreased mobility, decreased ROM, decreased strength, hypomobility, increased muscle spasms, impaired flexibility, improper body mechanics, postural dysfunction, and pain.   ACTIVITY LIMITATIONS: carrying, lifting, bending, and caring for others  PARTICIPATION LIMITATIONS: meal prep, cleaning, community activity, occupation, and yard work  PERSONAL FACTORS: Profession and Time since onset of injury/illness/exacerbation are also  affecting patient's functional outcome.   REHAB POTENTIAL: Good  CLINICAL DECISION MAKING: Stable/uncomplicated  EVALUATION COMPLEXITY: Low   GOALS: Goals reviewed with patient? Yes  SHORT TERM GOALS: Target date: 04/26/2023    Patient will be independent with HEP in order to improve functional  outcomes. Baseline: Goal status: INITIAL  2.  Patient will report at least 25% improvement in symptoms for improved quality of life. Baseline:  Goal status: INITIAL    LONG TERM GOALS: Target date: 05/10/2023    Patient will report at least 75% improvement in symptoms for improved quality of life. Baseline:  Goal status: INITIAL  2.  Patient will improve FOTO score to predicted outcomes in order to indicate improved tolerance to activity. Baseline:  Goal status: INITIAL  3.  Patient will demonstrate at least 25% improvement in cervical ROM in all restricted planes for improved ability to move head while working. Baseline: see above Goal status: INITIAL  4.  Patient will report centralized cervical symptoms no greater than 2/10 for improved ability to use L hand at home and work.  Baseline: current 4/10 Goal status: INITIAL  5.  Patient will demonstrate grade of 5/5 MMT grade in all tested musculature as evidence of improved strength to assist with lifting and gripping. Baseline: see above Goal status: INITIAL    PLAN:  PT FREQUENCY: 1-2x/week  PT DURATION: 4 weeks  PLANNED INTERVENTIONS: Therapeutic exercises, Therapeutic activity, Neuromuscular re-education, Balance training, Gait training, Patient/Family education, Joint manipulation, Joint mobilization, Stair training, Orthotic/Fit training, DME instructions, Aquatic Therapy, Dry Needling, Electrical stimulation, Spinal manipulation, Spinal mobilization, Cryotherapy, Moist heat, Compression bandaging, scar mobilization, Splintting, Taping, Traction, Ultrasound, Ionotophoresis 4mg /ml Dexamethasone, and Manual therapy  PLAN FOR NEXT SESSION: f/u with HEP; possibly DN, manual cervical/thoracic spine/ ribs for mobility/symptoms; postural strength, L shoulder strength, grip strength; spinal mobility   Reola Mosher , PT 05/04/2023, 11:18 AM

## 2023-05-05 DIAGNOSIS — Z01818 Encounter for other preprocedural examination: Secondary | ICD-10-CM | POA: Diagnosis not present

## 2023-05-13 DIAGNOSIS — M4712 Other spondylosis with myelopathy, cervical region: Secondary | ICD-10-CM | POA: Diagnosis not present

## 2023-05-13 DIAGNOSIS — Z6826 Body mass index (BMI) 26.0-26.9, adult: Secondary | ICD-10-CM | POA: Diagnosis not present

## 2023-06-15 ENCOUNTER — Ambulatory Visit (HOSPITAL_BASED_OUTPATIENT_CLINIC_OR_DEPARTMENT_OTHER): Payer: 59 | Attending: Physical Medicine and Rehabilitation | Admitting: Physical Therapy

## 2023-06-15 ENCOUNTER — Encounter (HOSPITAL_BASED_OUTPATIENT_CLINIC_OR_DEPARTMENT_OTHER): Payer: Self-pay | Admitting: Physical Therapy

## 2023-06-15 DIAGNOSIS — R29898 Other symptoms and signs involving the musculoskeletal system: Secondary | ICD-10-CM | POA: Diagnosis not present

## 2023-06-15 DIAGNOSIS — M542 Cervicalgia: Secondary | ICD-10-CM | POA: Insufficient documentation

## 2023-06-15 NOTE — Therapy (Signed)
OUTPATIENT PHYSICAL THERAPY TREATMENT   Patient Name: JAIDAN FAGOT MRN: [id number] DOB:08-07-1961, 62 y.o., male Today's Date: 06/15/2023  Progress Note   Reporting Period 04/12/23 to 06/15/23   See note below for Objective Data and Assessment of Progress/Goals   END OF SESSION:  PT End of Session - 06/15/23 0932     Visit Number 4    Number of Visits 16    Date for PT Re-Evaluation 07/13/23    Authorization Type Primary: [employer] Secondary: generic [employer]    PT Start Time 0932    PT Stop Time 1010    PT Time Calculation (min) 38 min    Activity Tolerance Patient tolerated treatment well    Behavior During Therapy [facility] for tasks assessed/performed             Past Medical History:  Diagnosis Date   Hyperlipidemia    Hypertension    Prostate cancer [facility])    Past Surgical History:  Procedure Laterality Date   BACK SURGERY  2010   COLONOSCOPY N/A 07/23/2016   Procedure: COLONOSCOPY;  Surgeon: Corbin Ade, MD;  Location: [facility];  Service: Endoscopy;  Laterality: N/A;  10:00 AM   LYMPHADENECTOMY Bilateral 09/10/2020   Procedure: LYMPHADENECTOMY;  Surgeon: Sebastian Ache, MD;  Location: [facility];  Service: Urology;  Laterality: Bilateral;   PROSTATE BIOPSY     ROBOT ASSISTED LAPAROSCOPIC RADICAL PROSTATECTOMY N/A 09/10/2020   Procedure: XI ROBOTIC ASSISTED LAPAROSCOPIC RADICAL PROSTATECTOMY;  Surgeon: Sebastian Ache, MD;  Location: [facility];  Service: Urology;  Laterality: N/A;  3 HRS   Patient Active Problem List   Diagnosis Date Noted   Acute foot pain, right 02/28/2023   Prostate cancer (HCC) 09/10/2020   Elevated PSA 06/17/2020    PCP: Assunta Found MD  REFERRING PROVIDER: Tressie Stalker, MD  REFERRING DIAG: M54.2 (ICD-10-CM) - Cervicalgia  THERAPY DIAG:  Cervicalgia  Other symptoms and signs involving the musculoskeletal system  Rationale for Evaluation and Treatment: Rehabilitation  ONSET DATE: Earlier this  year  SUBJECTIVE:                                                                                                                                                                                                         SUBJECTIVE STATEMENT: Patient states he needs some more PT. Patient states 0% improvement since beginning PT. Recently began doing HEP again. Chin tucks feeling crunching and more sore following in neck and arm. Sometimes right hand feels a little heavy, different with gripping. Sore in base of  neck.   EVAL: Patient states neck pain and numbness that began earlier this year. Had shooting pain down back of arm from neck. He notes decreased grip strength in L hand and decreased dexterity while tying sutures. Maybe a little in R hand on strength loss. Symptoms follow a C8 dermatome in LUE. Symptoms began with insidious onset. Neck pain intermittent but neuropathy symptoms present in L arm C8 dermatome pretty constant. Started using robots for surgery in November 2023 when necessary/able. No L shoulder injury.   PERTINENT HISTORY:  Hx prostate cancer, HTN, HLD  PAIN:  Are you having pain? Yes: NPRS scale: 5-6/10 Pain location: neck and into LUE Pain description: achy Aggravating factors: intermittent Relieving factors: none  PRECAUTIONS: None  WEIGHT BEARING RESTRICTIONS: No  FALLS:  Has patient fallen in last 6 months? No   OCCUPATION: Surgeon  PLOF: Independent  PATIENT GOALS: get PT eval   OBJECTIVE:   DIAGNOSTIC FINDINGS:  MRI 01/27/23 IMPRESSION: 1. Cervical spondylosis as outlined and with findings most notably as follows. 2. At C7-T1, left-sided disc osteophyte ridge contributes to severe left neural foraminal narrowing. 3. At C6-C7, there is moderate-to-advanced disc degeneration (with mild degenerative endplate edema). Multifactorial mild-to-moderate spinal canal stenosis. Severe bilateral neural foraminal narrowing. 4. At C5-C6, there is  moderate-to-advanced disc degeneration (with mild degenerative endplate edema). A posterior disc osteophyte complex effaces the ventral thecal sac, mildly flattening the ventral aspect of the spinal cord. However, the dorsal CSF space is maintained within the spinal canal. Multifactorial bilateral neural foraminal narrowing (severe right, moderate/severe left). 5. At C3-C4, a posterior disc osteophyte complex and superimposed central disc protrusion efface the ventral thecal sac, mildly flattening the ventral aspect of the spinal cord. However, the dorsal CSF space is maintained within the spinal canal. Multifactorial bilateral neural foraminal narrowing also present at this level (severe right, moderate left).  PATIENT SURVEYS: FOTO complete next session- time limited  COGNITION: Overall cognitive status: Within functional limits for tasks assessed  SENSATION:Light touch: decreased L - C8  POSTURE: rounded shoulders, forward head, and increased thoracic kyphosis  PALPATION: TTP grossly cervical paraspinals R>L, hypomobile R and L UPA  throughout c/sp, trigger point L UT, hypomobile 1st rib bilaterally L>R with symptoms into L UE   CERVICAL ROM:   Active ROM A/PROM (deg) eval AROM  Flexion 50 32  Extension 30 * 29  Right lateral flexion 12 12  Left lateral flexion 15 5  Right rotation 29 27  Left rotation 34 25   (Blank rows = not tested) *=symptoms  UPPER EXTREMITY ROM: WFL for tasks assessed    UPPER EXTREMITY MMT:  MMT Right eval Left eval Right 06/15/23 Left 06/15/23  Shoulder flexion 5 4 4+ 4  Shoulder extension      Shoulder abduction 5 4 4+ 4  Shoulder adduction      Shoulder extension      Shoulder internal rotation 5 5 5 5   Shoulder external rotation 5 4+ 5 4+  Middle trapezius      Lower trapezius      Elbow flexion 5 5 5  4+  Elbow extension 5 5 5  4+  Wrist flexion      Wrist extension      Wrist ulnar deviation      Wrist radial deviation       Wrist pronation      Wrist supination      Grip strength WFL decreased decreased decreased   (Blank rows = not tested)  CERVICAL SPECIAL TESTS:  Spurling's test: Positive and Distraction test: Negative    TODAY'S TREATMENT:                                                                                                                              DATE:  06/15/23 Reassessment Cable bicep curl 15# 2 x 10  Cable bilateral shoulder extension 15# 2 x 10 Cable elbow extension 10# 2x 10    05/04/23 Manual: supine cervical retraction x 30, ret-ext x 5 Thoracic extension over chair 10 x 5 second holds Scapular retraction with GH ER RTB 2 x 10 Standing horizontal abduction GTB 2x10 Standing shoulder PNF D2 GTB 2 x 10  04/18/23 Seated cervical retractions 2 x 10  Seated scap retractions 2 x 10 Doorway pec stretch 3 x 30 second holds Supine cervical retractation 2x 10   Manual: R and L UPA grade III to mid/lower cervical spine, STM L UT, L first rib inferior glides grade III  Manual: STM to L UT pre and post dry needling for trigger point identification and muscular relaxation.  Trigger Point Dry-Needling  Treatment instructions: Expect mild to moderate muscle soreness. S/S of pneumothorax if dry needled over a lung field, and to seek immediate medical attention should they occur. Patient verbalized understanding of these instructions and education.  Patient Consent Given: Yes Education handout provided: Yes Muscles treated: L UT Electrical stimulation performed: No Parameters: N/A Treatment response/outcome: twitch response elicited  Seated UT stretch 3 x 30 second holds L UT Scapular retraction with GH ER RTB 2 x 10 Standing row RTB 2 x 10   04/12/23 Supine cervical retractation 2x 10   Seated cervical retractions 2 x 10  Seated scap retractions 2 x 10 Doorway pec stretch 3 x 30 second holds  PATIENT EDUCATION:  Education details: 04/18/23: HEP, DN;  EVAL: Patient educated  on exam findings, POC, scope of PT, HEP, and DN, posture. Person educated: Patient Education method: Explanation, Demonstration, and Handouts Education comprehension: verbalized understanding, returned demonstration, verbal cues required, and tactile cues required  HOME EXERCISE PROGRAM: Access Code: [email or web address] URL: [email or web address]  - Standing Bicep Curls with Resistance  - 1 x daily - 7 x weekly - 3 sets - 10 reps - Triceps Extension   - 1 x daily - 7 x weekly - 3 sets - 10 reps - Shoulder extension with resistance - Neutral  - 1 x daily - 7 x weekly - 3 sets - 10 reps  05/04/23- Standing Shoulder Horizontal Abduction with Resistance  - 1 x daily - 7 x weekly - 2 sets - 10 reps - Standing Shoulder Single Arm PNF D2 Flexion with Resistance (Mirrored)  - 1 x daily - 7 x weekly - 2 sets - 10 reps  04/18/23 - Shoulder External Rotation and Scapular Retraction with Resistance  - 1 x daily - 7 x weekly - 2 sets - 10 reps - Standing Shoulder Row with  Anchored Resistance  - 1 x daily - 7 x weekly - 2 sets - 10 reps  Date: 04/12/2023 - Supine Chin Tuck  - 2-3 x daily - 7 x weekly - 2 sets - 10 reps - 2-3 second hold - Seated Cervical Retraction  - 4-5 x daily - 7 x weekly - 2 sets - 10 reps - Seated Scapular Retraction  - 4-5 x daily - 7 x weekly - 2 sets - 10 reps - Doorway Pec Stretch at 60 Elevation  - 3 x daily - 7 x weekly - 3 reps - 30 second hold  ASSESSMENT:  CLINICAL IMPRESSION: Patient has met 1/2 short term goals and 0/5 long term goals with ability to complete HEP. Remaining goals not met due to continued deficits in symptoms, strength, ROM, activity tolerance, and functional mobility. Patient returns to therapy after some time off as provider has switched clinics and patient was wishing to continue with same therapist. Patient has made good progress toward remaining goals. Extending POC to continue to improve deficit. Patient will continue to benefit from skilled  physical therapy in order to improve function and reduce impairment.    OBJECTIVE IMPAIRMENTS: decreased activity tolerance, decreased endurance, decreased mobility, decreased ROM, decreased strength, hypomobility, increased muscle spasms, impaired flexibility, improper body mechanics, postural dysfunction, and pain.   ACTIVITY LIMITATIONS: carrying, lifting, bending, and caring for others  PARTICIPATION LIMITATIONS: meal prep, cleaning, community activity, occupation, and yard work  PERSONAL FACTORS: Profession and Time since onset of injury/illness/exacerbation are also affecting patient's functional outcome.   REHAB POTENTIAL: Good  CLINICAL DECISION MAKING: Stable/uncomplicated  EVALUATION COMPLEXITY: Low   GOALS: Goals reviewed with patient? Yes  SHORT TERM GOALS: Target date: 04/26/2023    Patient will be independent with HEP in order to improve functional outcomes. Baseline: Goal status: INITIAL  2.  Patient will report at least 25% improvement in symptoms for improved quality of life. Baseline:  Goal status: INITIAL    LONG TERM GOALS: Target date: 05/10/2023    Patient will report at least 75% improvement in symptoms for improved quality of life. Baseline:  Goal status: INITIAL  2.  Patient will improve FOTO score to predicted outcomes in order to indicate improved tolerance to activity. Baseline:  Goal status: INITIAL  3.  Patient will demonstrate at least 25% improvement in cervical ROM in all restricted planes for improved ability to move head while working. Baseline: see above Goal status: INITIAL  4.  Patient will report centralized cervical symptoms no greater than 2/10 for improved ability to use L hand at home and work.  Baseline: current 4/10 Goal status: INITIAL  5.  Patient will demonstrate grade of 5/5 MMT grade in all tested musculature as evidence of improved strength to assist with lifting and gripping. Baseline: see above Goal status:  INITIAL    PLAN:  PT FREQUENCY: 1-2x/week  PT DURATION: 4 weeks  PLANNED INTERVENTIONS: Therapeutic exercises, Therapeutic activity, Neuromuscular re-education, Balance training, Gait training, Patient/Family education, Joint manipulation, Joint mobilization, Stair training, Orthotic/Fit training, DME instructions, Aquatic Therapy, Dry Needling, Electrical stimulation, Spinal manipulation, Spinal mobilization, Cryotherapy, Moist heat, Compression bandaging, scar mobilization, Splintting, Taping, Traction, Ultrasound, Ionotophoresis 4mg /ml Dexamethasone, and Manual therapy  PLAN FOR NEXT SESSION: f/u with HEP; possibly DN, manual cervical/thoracic spine/ ribs for mobility/symptoms; postural strength, L shoulder strength, grip strength; spinal mobility   Reola Mosher Letonia Stead, PT 06/15/2023, 10:16 AM

## 2023-06-20 DIAGNOSIS — L304 Erythema intertrigo: Secondary | ICD-10-CM | POA: Diagnosis not present

## 2023-06-20 DIAGNOSIS — Z1283 Encounter for screening for malignant neoplasm of skin: Secondary | ICD-10-CM | POA: Diagnosis not present

## 2023-06-20 DIAGNOSIS — B360 Pityriasis versicolor: Secondary | ICD-10-CM | POA: Diagnosis not present

## 2023-06-20 DIAGNOSIS — C44519 Basal cell carcinoma of skin of other part of trunk: Secondary | ICD-10-CM | POA: Diagnosis not present

## 2023-06-20 DIAGNOSIS — X32XXXD Exposure to sunlight, subsequent encounter: Secondary | ICD-10-CM | POA: Diagnosis not present

## 2023-06-20 DIAGNOSIS — D225 Melanocytic nevi of trunk: Secondary | ICD-10-CM | POA: Diagnosis not present

## 2023-06-20 DIAGNOSIS — L570 Actinic keratosis: Secondary | ICD-10-CM | POA: Diagnosis not present

## 2023-06-21 ENCOUNTER — Other Ambulatory Visit (HOSPITAL_BASED_OUTPATIENT_CLINIC_OR_DEPARTMENT_OTHER): Payer: Self-pay

## 2023-06-21 MED ORDER — KETOCONAZOLE 2 % EX SHAM
MEDICATED_SHAMPOO | CUTANEOUS | 11 refills | Status: AC
  Filled 2023-06-21: qty 120, 30d supply, fill #0
  Filled 2023-07-16: qty 120, 30d supply, fill #1
  Filled 2023-10-08: qty 120, 30d supply, fill #2
  Filled 2024-01-08: qty 120, 30d supply, fill #3

## 2023-06-24 ENCOUNTER — Encounter (HOSPITAL_BASED_OUTPATIENT_CLINIC_OR_DEPARTMENT_OTHER): Payer: Self-pay | Admitting: Physical Therapy

## 2023-06-24 ENCOUNTER — Ambulatory Visit (HOSPITAL_BASED_OUTPATIENT_CLINIC_OR_DEPARTMENT_OTHER): Payer: 59 | Admitting: Physical Therapy

## 2023-06-24 DIAGNOSIS — R29898 Other symptoms and signs involving the musculoskeletal system: Secondary | ICD-10-CM

## 2023-06-24 DIAGNOSIS — M542 Cervicalgia: Secondary | ICD-10-CM

## 2023-06-24 NOTE — Therapy (Signed)
OUTPATIENT PHYSICAL THERAPY TREATMENT   Patient Name: [name] MRN: [id number] DOB:1960-11-07, 62 y.o., male Today's Date: 06/24/2023  Progress Note   Reporting Period 04/12/23 to 06/15/23   See note below for Objective Data and Assessment of Progress/Goals   END OF SESSION:  PT End of Session - 06/24/23 0926     Visit Number 5    Number of Visits 16    Date for PT Re-Evaluation 07/13/23    Authorization Type Primary: [employer] Secondary: generic [employer]    PT Start Time [phone]    PT Stop Time 1008    PT Time Calculation (min) 41 min    Activity Tolerance Patient tolerated treatment well    Behavior During Therapy [facility] for tasks assessed/performed             Past Medical History:  Diagnosis Date   Hyperlipidemia    Hypertension    Prostate cancer [facility]    Past Surgical History:  Procedure Laterality Date   BACK SURGERY  2010   COLONOSCOPY N/A 07/23/2016   Procedure: COLONOSCOPY;  Surgeon: Corbin Ade, MD;  Location: [facility];  Service: Endoscopy;  Laterality: N/A;  10:00 AM   LYMPHADENECTOMY Bilateral 09/10/2020   Procedure: LYMPHADENECTOMY;  Surgeon: Sebastian Ache, MD;  Location: [facility];  Service: Urology;  Laterality: Bilateral;   PROSTATE BIOPSY     ROBOT ASSISTED LAPAROSCOPIC RADICAL PROSTATECTOMY N/A 09/10/2020   Procedure: XI ROBOTIC ASSISTED LAPAROSCOPIC RADICAL PROSTATECTOMY;  Surgeon: Sebastian Ache, MD;  Location: [facility];  Service: Urology;  Laterality: N/A;  3 HRS   Patient Active Problem List   Diagnosis Date Noted   Acute foot pain, right 02/28/2023   Prostate cancer (HCC) 09/10/2020   Elevated PSA 06/17/2020    PCP: Assunta Found MD  REFERRING PROVIDER: Tressie Stalker, MD  REFERRING DIAG: M54.2 (ICD-10-CM) - Cervicalgia  THERAPY DIAG:  Cervicalgia  Other symptoms and signs involving the musculoskeletal system  Rationale for Evaluation and Treatment: Rehabilitation  ONSET DATE: Earlier this  year  SUBJECTIVE:                                                                                                                                                                                                         SUBJECTIVE STATEMENT: Patient states he needs some more PT. Patient states 0% improvement since beginning PT. Recently began doing HEP again. Chin tucks feeling crunching and more sore following in neck and arm. Sometimes right hand feels a little heavy, different with gripping. Sore in base of  neck.   EVAL: Patient states neck pain and numbness that began earlier this year. Had shooting pain down back of arm from neck. He notes decreased grip strength in L hand and decreased dexterity while tying sutures. Maybe a little in R hand on strength loss. Symptoms follow a C8 dermatome in LUE. Symptoms began with insidious onset. Neck pain intermittent but neuropathy symptoms present in L arm C8 dermatome pretty constant. Started using robots for surgery in November 2023 when necessary/able. No L shoulder injury.   PERTINENT HISTORY:  Hx prostate cancer, HTN, HLD  PAIN:  Are you having pain? Yes: NPRS scale: 5-6/10 Pain location: neck and into LUE Pain description: achy Aggravating factors: intermittent Relieving factors: none  PRECAUTIONS: None  WEIGHT BEARING RESTRICTIONS: No  FALLS:  Has patient fallen in last 6 months? No   OCCUPATION: Surgeon  PLOF: Independent  PATIENT GOALS: get PT eval   OBJECTIVE:   DIAGNOSTIC FINDINGS:  MRI 01/27/23 IMPRESSION: 1. Cervical spondylosis as outlined and with findings most notably as follows. 2. At C7-T1, left-sided disc osteophyte ridge contributes to severe left neural foraminal narrowing. 3. At C6-C7, there is moderate-to-advanced disc degeneration (with mild degenerative endplate edema). Multifactorial mild-to-moderate spinal canal stenosis. Severe bilateral neural foraminal narrowing. 4. At C5-C6, there is  moderate-to-advanced disc degeneration (with mild degenerative endplate edema). A posterior disc osteophyte complex effaces the ventral thecal sac, mildly flattening the ventral aspect of the spinal cord. However, the dorsal CSF space is maintained within the spinal canal. Multifactorial bilateral neural foraminal narrowing (severe right, moderate/severe left). 5. At C3-C4, a posterior disc osteophyte complex and superimposed central disc protrusion efface the ventral thecal sac, mildly flattening the ventral aspect of the spinal cord. However, the dorsal CSF space is maintained within the spinal canal. Multifactorial bilateral neural foraminal narrowing also present at this level (severe right, moderate left).  PATIENT SURVEYS: FOTO complete next session- time limited  COGNITION: Overall cognitive status: Within functional limits for tasks assessed  SENSATION:Light touch: decreased L - C8  POSTURE: rounded shoulders, forward head, and increased thoracic kyphosis  PALPATION: TTP grossly cervical paraspinals R>L, hypomobile R and L UPA  throughout c/sp, trigger point L UT, hypomobile 1st rib bilaterally L>R with symptoms into L UE   CERVICAL ROM:   Active ROM A/PROM (deg) eval AROM  Flexion 50 32  Extension 30 * 29  Right lateral flexion 12 12  Left lateral flexion 15 5  Right rotation 29 27  Left rotation 34 25   (Blank rows = not tested) *=symptoms  UPPER EXTREMITY ROM: WFL for tasks assessed    UPPER EXTREMITY MMT:  MMT Right eval Left eval Right 06/15/23 Left 06/15/23  Shoulder flexion 5 4 4+ 4  Shoulder extension      Shoulder abduction 5 4 4+ 4  Shoulder adduction      Shoulder extension      Shoulder internal rotation 5 5 5 5   Shoulder external rotation 5 4+ 5 4+  Middle trapezius      Lower trapezius      Elbow flexion 5 5 5  4+  Elbow extension 5 5 5  4+  Wrist flexion      Wrist extension      Wrist ulnar deviation      Wrist radial deviation       Wrist pronation      Wrist supination      Grip strength WFL decreased decreased decreased   (Blank rows = not tested)  CERVICAL SPECIAL TESTS:  Spurling's test: Positive and Distraction test: Negative    TODAY'S TREATMENT:                                                                                                                              DATE:  UBE level 6, 2 forward 2 retro Seated Row 25# 3 x 10  Seated lat pull down 20# 3 x 10 Seated shoulder press 10# 3 x 10  Seated low row machine 25# 3 x 10 Chest press 25# 3 x 10    06/15/23 Reassessment Cable bicep curl 15# 2 x 10  Cable bilateral shoulder extension 15# 2 x 10 Cable elbow extension 10# 2x 10    05/04/23 Manual: supine cervical retraction x 30, ret-ext x 5 Thoracic extension over chair 10 x 5 second holds Scapular retraction with GH ER RTB 2 x 10 Standing horizontal abduction GTB 2x10 Standing shoulder PNF D2 GTB 2 x 10  04/18/23 Seated cervical retractions 2 x 10  Seated scap retractions 2 x 10 Doorway pec stretch 3 x 30 second holds Supine cervical retractation 2x 10   Manual: R and L UPA grade III to mid/lower cervical spine, STM L UT, L first rib inferior glides grade III  Manual: STM to L UT pre and post dry needling for trigger point identification and muscular relaxation.  Trigger Point Dry-Needling  Treatment instructions: Expect mild to moderate muscle soreness. S/S of pneumothorax if dry needled over a lung field, and to seek immediate medical attention should they occur. Patient verbalized understanding of these instructions and education.  Patient Consent Given: Yes Education handout provided: Yes Muscles treated: L UT Electrical stimulation performed: No Parameters: N/A Treatment response/outcome: twitch response elicited  Seated UT stretch 3 x 30 second holds L UT Scapular retraction with GH ER RTB 2 x 10 Standing row RTB 2 x 10   04/12/23 Supine cervical retractation 2x 10   Seated  cervical retractions 2 x 10  Seated scap retractions 2 x 10 Doorway pec stretch 3 x 30 second holds  PATIENT EDUCATION:  Education details: 04/18/23: HEP, DN;  EVAL: Patient educated on exam findings, POC, scope of PT, HEP, and DN, posture. Person educated: Patient Education method: Explanation, Demonstration, and Handouts Education comprehension: verbalized understanding, returned demonstration, verbal cues required, and tactile cues required  HOME EXERCISE PROGRAM: Access Code: [email or web address] URL: [email or web address]  - Standing Bicep Curls with Resistance  - 1 x daily - 7 x weekly - 3 sets - 10 reps - Triceps Extension   - 1 x daily - 7 x weekly - 3 sets - 10 reps - Shoulder extension with resistance - Neutral  - 1 x daily - 7 x weekly - 3 sets - 10 reps  05/04/23- Standing Shoulder Horizontal Abduction with Resistance  - 1 x daily - 7 x weekly - 2 sets - 10 reps - Standing Shoulder Single Arm PNF D2 Flexion with Resistance (  Mirrored)  - 1 x daily - 7 x weekly - 2 sets - 10 reps  04/18/23 - Shoulder External Rotation and Scapular Retraction with Resistance  - 1 x daily - 7 x weekly - 2 sets - 10 reps - Standing Shoulder Row with Anchored Resistance  - 1 x daily - 7 x weekly - 2 sets - 10 reps  Date: 04/12/2023 - Supine Chin Tuck  - 2-3 x daily - 7 x weekly - 2 sets - 10 reps - 2-3 second hold - Seated Cervical Retraction  - 4-5 x daily - 7 x weekly - 2 sets - 10 reps - Seated Scapular Retraction  - 4-5 x daily - 7 x weekly - 2 sets - 10 reps - Doorway Pec Stretch at 60 Elevation  - 3 x daily - 7 x weekly - 3 reps - 30 second hold  9/27 - Seated Row [employer]  - 1 x daily - 7 x weekly - 3 sets - 10 reps - Lat Pull Down  - 1 x daily - 7 x weekly - 3 sets - 10 reps  ASSESSMENT:  CLINICAL IMPRESSION: Began session with UBE for dynamic warm up and conditioning. UE strength training performed with cueing for posture and mechanics PRN. 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  Patient will continue to benefit from skilled physical therapy in order to improve function and reduce impairment.    OBJECTIVE IMPAIRMENTS: decreased activity tolerance, decreased endurance, decreased mobility, decreased ROM, decreased strength, hypomobility, increased muscle spasms, impaired flexibility, improper body mechanics, postural dysfunction, and pain.   ACTIVITY LIMITATIONS: carrying, lifting, bending, and caring for others  PARTICIPATION LIMITATIONS: meal prep, cleaning, community activity, occupation, and yard work  PERSONAL FACTORS: Profession and Time since onset of injury/illness/exacerbation are also affecting patient's functional outcome.   REHAB POTENTIAL: Good  CLINICAL DECISION MAKING: Stable/uncomplicated  EVALUATION COMPLEXITY: Low   GOALS: Goals reviewed with patient? Yes  SHORT TERM GOALS: Target date: 04/26/2023    Patient will be independent with HEP in order to improve functional outcomes. Baseline: Goal status: INITIAL  2.  Patient will report at least 25% improvement in symptoms for improved quality of life. Baseline:  Goal status: INITIAL    LONG TERM GOALS: Target date: 05/10/2023    Patient will report at least 75% improvement in symptoms for improved quality of life. Baseline:  Goal status: INITIAL  2.  Patient will improve FOTO score to predicted outcomes in order to indicate improved tolerance to activity. Baseline:  Goal status: INITIAL  3.  Patient will demonstrate at least 25% improvement in cervical ROM in all restricted planes for improved ability to move head while working. Baseline: see above Goal status: INITIAL  4.  Patient will report centralized cervical symptoms no greater than 2/10 for improved ability to use L hand at home and work.  Baseline: current 4/10 Goal status: INITIAL  5.  Patient will demonstrate grade of 5/5 MMT grade in all tested musculature as  evidence of improved strength to assist with lifting and gripping. Baseline: see above Goal status: INITIAL    PLAN:  PT FREQUENCY: 1-2x/week  PT DURATION: 4 weeks  PLANNED INTERVENTIONS: Therapeutic exercises, Therapeutic activity, Neuromuscular re-education, Balance training, Gait training, Patient/Family education, Joint manipulation, Joint mobilization, Stair training, Orthotic/Fit training, DME instructions, Aquatic Therapy, Dry Needling, Electrical stimulation, Spinal manipulation, Spinal mobilization, Cryotherapy, Moist heat, Compression bandaging, scar mobilization, Splintting, Taping, Traction, Ultrasound, Ionotophoresis  4mg /ml Dexamethasone, and Manual therapy  PLAN FOR NEXT SESSION: f/u with HEP; possibly DN, manual cervical/thoracic spine/ ribs for mobility/symptoms; postural strength, L shoulder strength, grip strength; spinal mobility   Wyman Songster, PT 06/24/2023, 9:27 AM

## 2023-06-30 ENCOUNTER — Ambulatory Visit (HOSPITAL_BASED_OUTPATIENT_CLINIC_OR_DEPARTMENT_OTHER): Payer: 59 | Attending: Physical Medicine and Rehabilitation | Admitting: Physical Therapy

## 2023-06-30 ENCOUNTER — Encounter (HOSPITAL_BASED_OUTPATIENT_CLINIC_OR_DEPARTMENT_OTHER): Payer: Self-pay | Admitting: Physical Therapy

## 2023-06-30 DIAGNOSIS — M542 Cervicalgia: Secondary | ICD-10-CM | POA: Diagnosis not present

## 2023-06-30 DIAGNOSIS — R29898 Other symptoms and signs involving the musculoskeletal system: Secondary | ICD-10-CM | POA: Insufficient documentation

## 2023-06-30 NOTE — Therapy (Signed)
OUTPATIENT PHYSICAL THERAPY TREATMENT   Patient Name: [name] MRN: [id number] DOB:01/25/1961, 62 y.o., male Today's Date: 06/30/2023    END OF SESSION:  PT End of Session - 06/30/23 1436     Visit Number 6    Number of Visits 16    Date for PT Re-Evaluation 07/13/23    Authorization Type Primary: [employer] Secondary: generic [employer]    PT Start Time 1436    PT Stop Time 1515    PT Time Calculation (min) 39 min    Activity Tolerance Patient tolerated treatment well    Behavior During Therapy WFL for tasks assessed/performed             Past Medical History:  Diagnosis Date   Hyperlipidemia    Hypertension    Prostate cancer [facility])    Past Surgical History:  Procedure Laterality Date   BACK SURGERY  2010   COLONOSCOPY N/A 07/23/2016   Procedure: COLONOSCOPY;  Surgeon: Corbin Ade, MD;  Location: [facility];  Service: Endoscopy;  Laterality: N/A;  10:00 AM   LYMPHADENECTOMY Bilateral 09/10/2020   Procedure: LYMPHADENECTOMY;  Surgeon: Sebastian Ache, MD;  Location: [facility];  Service: Urology;  Laterality: Bilateral;   PROSTATE BIOPSY     ROBOT ASSISTED LAPAROSCOPIC RADICAL PROSTATECTOMY N/A 09/10/2020   Procedure: XI ROBOTIC ASSISTED LAPAROSCOPIC RADICAL PROSTATECTOMY;  Surgeon: Sebastian Ache, MD;  Location: [facility];  Service: Urology;  Laterality: N/A;  3 HRS   Patient Active Problem List   Diagnosis Date Noted   Acute foot pain, right 02/28/2023   Prostate cancer (HCC) 09/10/2020   Elevated PSA 06/17/2020    PCP: Assunta Found MD  REFERRING PROVIDER: Tressie Stalker, MD  REFERRING DIAG: M54.2 (ICD-10-CM) - Cervicalgia  THERAPY DIAG:  Cervicalgia  Other symptoms and signs involving the musculoskeletal system  Rationale for Evaluation and Treatment: Rehabilitation  ONSET DATE: Earlier this year  SUBJECTIVE:                                                                                                                                                                                                          SUBJECTIVE STATEMENT: Patient states he has been doing gym machines. Some sorenss  EVAL: Patient states neck pain and numbness that began earlier this year. Had shooting pain down back of arm from neck. He notes decreased grip strength in L hand and decreased dexterity while tying sutures. Maybe a little in R hand on strength loss. Symptoms follow a C8 dermatome in LUE. Symptoms began with insidious  onset. Neck pain intermittent but neuropathy symptoms present in L arm C8 dermatome pretty constant. Started using robots for surgery in November 2023 when necessary/able. No L shoulder injury.   PERTINENT HISTORY:  Hx prostate cancer, HTN, HLD  PAIN:  Are you having pain? Yes: NPRS scale: 5-6/10 Pain location: neck and into LUE Pain description: achy Aggravating factors: intermittent Relieving factors: none  PRECAUTIONS: None  WEIGHT BEARING RESTRICTIONS: No  FALLS:  Has patient fallen in last 6 months? No   OCCUPATION: Surgeon  PLOF: Independent  PATIENT GOALS: get PT eval   OBJECTIVE:   DIAGNOSTIC FINDINGS:  MRI 01/27/23 IMPRESSION: 1. Cervical spondylosis as outlined and with findings most notably as follows. 2. At C7-T1, left-sided disc osteophyte ridge contributes to severe left neural foraminal narrowing. 3. At C6-C7, there is moderate-to-advanced disc degeneration (with mild degenerative endplate edema). Multifactorial mild-to-moderate spinal canal stenosis. Severe bilateral neural foraminal narrowing. 4. At C5-C6, there is moderate-to-advanced disc degeneration (with mild degenerative endplate edema). A posterior disc osteophyte complex effaces the ventral thecal sac, mildly flattening the ventral aspect of the spinal cord. However, the dorsal CSF space is maintained within the spinal canal. Multifactorial bilateral neural foraminal narrowing (severe right, moderate/severe left). 5. At  C3-C4, a posterior disc osteophyte complex and superimposed central disc protrusion efface the ventral thecal sac, mildly flattening the ventral aspect of the spinal cord. However, the dorsal CSF space is maintained within the spinal canal. Multifactorial bilateral neural foraminal narrowing also present at this level (severe right, moderate left).  PATIENT SURVEYS: FOTO complete next session- time limited  COGNITION: Overall cognitive status: Within functional limits for tasks assessed  SENSATION:Light touch: decreased L - C8  POSTURE: rounded shoulders, forward head, and increased thoracic kyphosis  PALPATION: TTP grossly cervical paraspinals R>L, hypomobile R and L UPA  throughout c/sp, trigger point L UT, hypomobile 1st rib bilaterally L>R with symptoms into L UE   CERVICAL ROM:   Active ROM A/PROM (deg) eval AROM  Flexion 50 32  Extension 30 * 29  Right lateral flexion 12 12  Left lateral flexion 15 5  Right rotation 29 27  Left rotation 34 25   (Blank rows = not tested) *=symptoms  UPPER EXTREMITY ROM: WFL for tasks assessed    UPPER EXTREMITY MMT:  MMT Right eval Left eval Right 06/15/23 Left 06/15/23  Shoulder flexion 5 4 4+ 4  Shoulder extension      Shoulder abduction 5 4 4+ 4  Shoulder adduction      Shoulder extension      Shoulder internal rotation 5 5 5 5   Shoulder external rotation 5 4+ 5 4+  Middle trapezius      Lower trapezius      Elbow flexion 5 5 5  4+  Elbow extension 5 5 5  4+  Wrist flexion      Wrist extension      Wrist ulnar deviation      Wrist radial deviation      Wrist pronation      Wrist supination      Grip strength WFL decreased decreased decreased   (Blank rows = not tested)  CERVICAL SPECIAL TESTS:  Spurling's test: Positive and Distraction test: Negative    TODAY'S TREATMENT:  DATE:   06/30/23 UBE level 6, 2 forward 2 retro Pulldown 40# 4 x 10 Seated low row machine 25# 3 x 10 Bicep curl machine 40# 3 x 10  Cable shoulder ER at 90 abduction 5# 1 x 8 bilateral Cable shoulder IR at 90 abduction 5# 1 x 8 bilateral   06/24/23 UBE level 6, 2 forward 2 retro Seated Row 25# 3 x 10  Seated lat pull down 20# 3 x 10 Seated shoulder press 10# 3 x 10  Seated low row machine 25# 3 x 10 Chest press 25# 3 x 10    06/15/23 Reassessment Cable bicep curl 15# 2 x 10  Cable bilateral shoulder extension 15# 2 x 10 Cable elbow extension 10# 2x 10    05/04/23 Manual: supine cervical retraction x 30, ret-ext x 5 Thoracic extension over chair 10 x 5 second holds Scapular retraction with GH ER RTB 2 x 10 Standing horizontal abduction GTB 2x10 Standing shoulder PNF D2 GTB 2 x 10  04/18/23 Seated cervical retractions 2 x 10  Seated scap retractions 2 x 10 Doorway pec stretch 3 x 30 second holds Supine cervical retractation 2x 10   Manual: R and L UPA grade III to mid/lower cervical spine, STM L UT, L first rib inferior glides grade III  Manual: STM to L UT pre and post dry needling for trigger point identification and muscular relaxation.  Trigger Point Dry-Needling  Treatment instructions: Expect mild to moderate muscle soreness. S/S of pneumothorax if dry needled over a lung field, and to seek immediate medical attention should they occur. Patient verbalized understanding of these instructions and education.  Patient Consent Given: Yes Education handout provided: Yes Muscles treated: L UT Electrical stimulation performed: No Parameters: N/A Treatment response/outcome: twitch response elicited  Seated UT stretch 3 x 30 second holds L UT Scapular retraction with GH ER RTB 2 x 10 Standing row RTB 2 x 10   04/12/23 Supine cervical retractation 2x 10   Seated cervical retractions 2 x 10  Seated scap retractions 2 x 10 Doorway pec stretch 3 x 30 second holds  PATIENT  EDUCATION:  Education details: 04/18/23: HEP, DN;  EVAL: Patient educated on exam findings, POC, scope of PT, HEP, and DN, posture. Person educated: Patient Education method: Explanation, Demonstration, and Handouts Education comprehension: verbalized understanding, returned demonstration, verbal cues required, and tactile cues required  HOME EXERCISE PROGRAM: Access Code: [email or web address] URL: [email or web address]  - Standing Bicep Curls with Resistance  - 1 x daily - 7 x weekly - 3 sets - 10 reps - Triceps Extension   - 1 x daily - 7 x weekly - 3 sets - 10 reps - Shoulder extension with resistance - Neutral  - 1 x daily - 7 x weekly - 3 sets - 10 reps  05/04/23- Standing Shoulder Horizontal Abduction with Resistance  - 1 x daily - 7 x weekly - 2 sets - 10 reps - Standing Shoulder Single Arm PNF D2 Flexion with Resistance (Mirrored)  - 1 x daily - 7 x weekly - 2 sets - 10 reps  04/18/23 - Shoulder External Rotation and Scapular Retraction with Resistance  - 1 x daily - 7 x weekly - 2 sets - 10 reps - Standing Shoulder Row with Anchored Resistance  - 1 x daily - 7 x weekly - 2 sets - 10 reps  Date: 04/12/2023 - Supine Chin Tuck  - 2-3 x daily - 7 x weekly - 2 sets - 10 reps -  2-3 second hold - Seated Cervical Retraction  - 4-5 x daily - 7 x weekly - 2 sets - 10 reps - Seated Scapular Retraction  - 4-5 x daily - 7 x weekly - 2 sets - 10 reps - Doorway Pec Stretch at 60 Elevation  - 3 x daily - 7 x weekly - 3 reps - 30 second hold  9/27 - Seated Row [employer]  - 1 x daily - 7 x weekly - 3 sets - 10 reps - Lat Pull Down  - 1 x daily - 7 x weekly - 3 sets - 10 reps  ASSESSMENT:  CLINICAL IMPRESSION: Began session with UBE for dynamic warm up and conditioning. Continued with strength training which is tolerated well. Patient will continue to benefit from skilled physical therapy in order to improve function and reduce impairment.    OBJECTIVE IMPAIRMENTS: decreased activity  tolerance, decreased endurance, decreased mobility, decreased ROM, decreased strength, hypomobility, increased muscle spasms, impaired flexibility, improper body mechanics, postural dysfunction, and pain.   ACTIVITY LIMITATIONS: carrying, lifting, bending, and caring for others  PARTICIPATION LIMITATIONS: meal prep, cleaning, community activity, occupation, and yard work  PERSONAL FACTORS: Profession and Time since onset of injury/illness/exacerbation are also affecting patient's functional outcome.   REHAB POTENTIAL: Good  CLINICAL DECISION MAKING: Stable/uncomplicated  EVALUATION COMPLEXITY: Low   GOALS: Goals reviewed with patient? Yes  SHORT TERM GOALS: Target date: 04/26/2023    Patient will be independent with HEP in order to improve functional outcomes. Baseline: Goal status: INITIAL  2.  Patient will report at least 25% improvement in symptoms for improved quality of life. Baseline:  Goal status: INITIAL    LONG TERM GOALS: Target date: 05/10/2023    Patient will report at least 75% improvement in symptoms for improved quality of life. Baseline:  Goal status: INITIAL  2.  Patient will improve FOTO score to predicted outcomes in order to indicate improved tolerance to activity. Baseline:  Goal status: INITIAL  3.  Patient will demonstrate at least 25% improvement in cervical ROM in all restricted planes for improved ability to move head while working. Baseline: see above Goal status: INITIAL  4.  Patient will report centralized cervical symptoms no greater than 2/10 for improved ability to use L hand at home and work.  Baseline: current 4/10 Goal status: INITIAL  5.  Patient will demonstrate grade of 5/5 MMT grade in all tested musculature as evidence of improved strength to assist with lifting and gripping. Baseline: see above Goal status: INITIAL    PLAN:  PT FREQUENCY: 1-2x/week  PT DURATION: 4 weeks  PLANNED INTERVENTIONS: Therapeutic exercises,  Therapeutic activity, Neuromuscular re-education, Balance training, Gait training, Patient/Family education, Joint manipulation, Joint mobilization, Stair training, Orthotic/Fit training, DME instructions, Aquatic Therapy, Dry Needling, Electrical stimulation, Spinal manipulation, Spinal mobilization, Cryotherapy, Moist heat, Compression bandaging, scar mobilization, Splintting, Taping, Traction, Ultrasound, Ionotophoresis 4mg /ml Dexamethasone, and Manual therapy  PLAN FOR NEXT SESSION: f/u with HEP; possibly DN, manual cervical/thoracic spine/ ribs for mobility/symptoms; postural strength, L shoulder strength, grip strength; spinal mobility   Wyman Songster, PT 06/30/2023, 2:36 PM

## 2023-07-09 ENCOUNTER — Encounter (HOSPITAL_BASED_OUTPATIENT_CLINIC_OR_DEPARTMENT_OTHER): Payer: Self-pay | Admitting: Physical Therapy

## 2023-07-09 ENCOUNTER — Ambulatory Visit (HOSPITAL_BASED_OUTPATIENT_CLINIC_OR_DEPARTMENT_OTHER): Payer: 59 | Admitting: Physical Therapy

## 2023-07-09 DIAGNOSIS — M542 Cervicalgia: Secondary | ICD-10-CM | POA: Diagnosis not present

## 2023-07-09 DIAGNOSIS — R29898 Other symptoms and signs involving the musculoskeletal system: Secondary | ICD-10-CM | POA: Diagnosis not present

## 2023-07-09 NOTE — Therapy (Signed)
OUTPATIENT PHYSICAL THERAPY TREATMENT   Patient Name: ABIOLA FADDIS MRN: [id number] DOB:1961/07/06, 62 y.o., male Today's Date: 07/09/2023  Progress Note   Reporting Period 06/15/23 to 07/09/23   See note below for Objective Data and Assessment of Progress/Goals    END OF SESSION:  PT End of Session - 07/09/23 0953     Visit Number 7    Number of Visits 28    Date for PT Re-Evaluation 08/20/23    Authorization Type Primary: [employer] Secondary: generic [employer]    PT Start Time 1000    PT Stop Time 1040    PT Time Calculation (min) 40 min    Activity Tolerance Patient tolerated treatment well    Behavior During Therapy [facility] for tasks assessed/performed             Past Medical History:  Diagnosis Date   Hyperlipidemia    Hypertension    Prostate cancer [facility])    Past Surgical History:  Procedure Laterality Date   BACK SURGERY  2010   COLONOSCOPY N/A 07/23/2016   Procedure: COLONOSCOPY;  Surgeon: Corbin Ade, MD;  Location: [facility];  Service: Endoscopy;  Laterality: N/A;  10:00 AM   LYMPHADENECTOMY Bilateral 09/10/2020   Procedure: LYMPHADENECTOMY;  Surgeon: Sebastian Ache, MD;  Location: [facility]S;  Service: Urology;  Laterality: Bilateral;   PROSTATE BIOPSY     ROBOT ASSISTED LAPAROSCOPIC RADICAL PROSTATECTOMY N/A 09/10/2020   Procedure: XI ROBOTIC ASSISTED LAPAROSCOPIC RADICAL PROSTATECTOMY;  Surgeon: Sebastian Ache, MD;  Location: [facility] OR[facility];  Service: Urology;  Laterality: N/A;  3 HRS   Patient Active Problem List   Diagnosis Date Noted   Acute foot pain, right 02/28/2023   Prostate cancer (HCC) 09/10/2020   Elevated PSA 06/17/2020    PCP: Assunta Found MD  REFERRING PROVIDER: Tressie Stalker, MD  REFERRING DIAG: M54.2 (ICD-10-CM) - Cervicalgia  THERAPY DIAG:  Cervicalgia  Other symptoms and signs involving the musculoskeletal system  Rationale for Evaluation and Treatment: Rehabilitation  ONSET DATE: Earlier this  year  SUBJECTIVE:                                                                                                                                                                                                         SUBJECTIVE STATEMENT: Patient states everything been doing alright, intermittent neck pain. Problems with rotating head with sharp pain to arm. HEP going well and so arm gym exercises. Gradually increasing weights. Limited with grip strength and dexterity.   EVAL: Patient states neck pain and numbness  that began earlier this year. Had shooting pain down back of arm from neck. He notes decreased grip strength in L hand and decreased dexterity while tying sutures. Maybe a little in R hand on strength loss. Symptoms follow a C8 dermatome in LUE. Symptoms began with insidious onset. Neck pain intermittent but neuropathy symptoms present in L arm C8 dermatome pretty constant. Started using robots for surgery in November 2023 when necessary/able. No L shoulder injury.   PERTINENT HISTORY:  Hx prostate cancer, HTN, HLD  PAIN:  Are you having pain? Yes: NPRS scale: 6-7/10 Pain location: neck and into LUE Pain description: achy Aggravating factors: intermittent Relieving factors: none  PRECAUTIONS: None  WEIGHT BEARING RESTRICTIONS: No  FALLS:  Has patient fallen in last 6 months? No   OCCUPATION: Surgeon  PLOF: Independent  PATIENT GOALS: get PT eval   OBJECTIVE:   DIAGNOSTIC FINDINGS:  MRI 01/27/23 IMPRESSION: 1. Cervical spondylosis as outlined and with findings most notably as follows. 2. At C7-T1, left-sided disc osteophyte ridge contributes to severe left neural foraminal narrowing. 3. At C6-C7, there is moderate-to-advanced disc degeneration (with mild degenerative endplate edema). Multifactorial mild-to-moderate spinal canal stenosis. Severe bilateral neural foraminal narrowing. 4. At C5-C6, there is moderate-to-advanced disc degeneration (with mild  degenerative endplate edema). A posterior disc osteophyte complex effaces the ventral thecal sac, mildly flattening the ventral aspect of the spinal cord. However, the dorsal CSF space is maintained within the spinal canal. Multifactorial bilateral neural foraminal narrowing (severe right, moderate/severe left). 5. At C3-C4, a posterior disc osteophyte complex and superimposed central disc protrusion efface the ventral thecal sac, mildly flattening the ventral aspect of the spinal cord. However, the dorsal CSF space is maintained within the spinal canal. Multifactorial bilateral neural foraminal narrowing also present at this level (severe right, moderate left).  PATIENT SURVEYS: FOTO complete next session- time limited  COGNITION: Overall cognitive status: Within functional limits for tasks assessed  SENSATION:Light touch: decreased L - C8  POSTURE: rounded shoulders, forward head, and increased thoracic kyphosis  PALPATION: TTP grossly cervical paraspinals R>L, hypomobile R and L UPA  throughout c/sp, trigger point L UT, hypomobile 1st rib bilaterally L>R with symptoms into L UE   CERVICAL ROM:   Active ROM A/PROM (deg) eval AROM 07/09/23  Flexion 50 32 40  Extension 30 * 29 16  Right lateral flexion 12 12 12   Left lateral flexion 15 5 10   Right rotation 29 27 40  Left rotation 34 25 30   (Blank rows = not tested) *=symptoms  UPPER EXTREMITY ROM: WFL for tasks assessed    UPPER EXTREMITY MMT:  MMT Right eval Left eval Right 06/15/23 Left 06/15/23 Right 07/09/23 Left 07/09/23  Shoulder flexion 5 4 4+ 4 4 4+  Shoulder extension        Shoulder abduction 5 4 4+ 4 4+ 4+  Shoulder adduction        Shoulder extension        Shoulder internal rotation 5 5 5 5 5 5   Shoulder external rotation 5 4+ 5 4+ 5 5  Middle trapezius        Lower trapezius        Elbow flexion 5 5 5  4+ 4+ 4+  Elbow extension 5 5 5  4+ 5 4+  Wrist flexion        Wrist extension        Wrist  ulnar deviation        Wrist radial deviation  Wrist pronation        Wrist supination        Grip strength WFL decreased decreased decreased decreased decreased   (Blank rows = not tested)  CERVICAL SPECIAL TESTS:  Spurling's test: Positive and Distraction test: Negative    TODAY'S TREATMENT:                                                                                                                              DATE:  07/09/23 UBE level 6, 2 forward 2 retro Reassessment Standing shoulder abduction 5# 2 x 10 Standing shoulder scaption 5# 2 x 10 Prone shoulder extension 3# 2 x 10  Prone shoulder horizontal abduction 3# 2 x 10  Standing hammer curl 8# 2 x 10  Standing pronated curls 5# 2 x 10   06/30/23 UBE level 6, 2 forward 2 retro Pulldown 40# 4 x 10 Seated low row machine 25# 3 x 10 Bicep curl machine 40# 3 x 10  Cable shoulder ER at 90 abduction 5# 1 x 8 bilateral Cable shoulder IR at 90 abduction 5# 1 x 8 bilateral   06/24/23 UBE level 6, 2 forward 2 retro Seated Row 25# 3 x 10  Seated lat pull down 20# 3 x 10 Seated shoulder press 10# 3 x 10  Seated low row machine 25# 3 x 10 Chest press 25# 3 x 10    06/15/23 Reassessment Cable bicep curl 15# 2 x 10  Cable bilateral shoulder extension 15# 2 x 10 Cable elbow extension 10# 2x 10    05/04/23 Manual: supine cervical retraction x 30, ret-ext x 5 Thoracic extension over chair 10 x 5 second holds Scapular retraction with GH ER RTB 2 x 10 Standing horizontal abduction GTB 2x10 Standing shoulder PNF D2 GTB 2 x 10  04/18/23 Seated cervical retractions 2 x 10  Seated scap retractions 2 x 10 Doorway pec stretch 3 x 30 second holds Supine cervical retractation 2x 10   Manual: R and L UPA grade III to mid/lower cervical spine, STM L UT, L first rib inferior glides grade III  Manual: STM to L UT pre and post dry needling for trigger point identification and muscular relaxation.  Trigger Point Dry-Needling   Treatment instructions: Expect mild to moderate muscle soreness. S/S of pneumothorax if dry needled over a lung field, and to seek immediate medical attention should they occur. Patient verbalized understanding of these instructions and education.  Patient Consent Given: Yes Education handout provided: Yes Muscles treated: L UT Electrical stimulation performed: No Parameters: N/A Treatment response/outcome: twitch response elicited  Seated UT stretch 3 x 30 second holds L UT Scapular retraction with GH ER RTB 2 x 10 Standing row RTB 2 x 10   04/12/23 Supine cervical retractation 2x 10   Seated cervical retractions 2 x 10  Seated scap retractions 2 x 10 Doorway pec stretch 3 x 30 second holds  PATIENT EDUCATION:  Education details:  04/18/23: HEP, DN;  EVAL: Patient educated on exam findings, POC, scope of PT, HEP, and DN, posture. 10/12: reassessment, POC, HEP Person educated: Patient Education method: Explanation, Demonstration, and Handouts Education comprehension: verbalized understanding, returned demonstration, verbal cues required, and tactile cues required  HOME EXERCISE PROGRAM: Access Code: [email or web address] URL: [email or web address]  - Standing Bicep Curls with Resistance  - 1 x daily - 7 x weekly - 3 sets - 10 reps - Triceps Extension   - 1 x daily - 7 x weekly - 3 sets - 10 reps - Shoulder extension with resistance - Neutral  - 1 x daily - 7 x weekly - 3 sets - 10 reps  05/04/23- Standing Shoulder Horizontal Abduction with Resistance  - 1 x daily - 7 x weekly - 2 sets - 10 reps - Standing Shoulder Single Arm PNF D2 Flexion with Resistance (Mirrored)  - 1 x daily - 7 x weekly - 2 sets - 10 reps  04/18/23 - Shoulder External Rotation and Scapular Retraction with Resistance  - 1 x daily - 7 x weekly - 2 sets - 10 reps - Standing Shoulder Row with Anchored Resistance  - 1 x daily - 7 x weekly - 2 sets - 10 reps  Date: 04/12/2023 - Supine Chin Tuck  - 2-3 x daily - 7  x weekly - 2 sets - 10 reps - 2-3 second hold - Seated Cervical Retraction  - 4-5 x daily - 7 x weekly - 2 sets - 10 reps - Seated Scapular Retraction  - 4-5 x daily - 7 x weekly - 2 sets - 10 reps - Doorway Pec Stretch at 60 Elevation  - 3 x daily - 7 x weekly - 3 reps - 30 second hold  9/27 - Seated Row [employer]  - 1 x daily - 7 x weekly - 3 sets - 10 reps - Lat Pull Down  - 1 x daily - 7 x weekly - 3 sets - 10 reps  ASSESSMENT:  CLINICAL IMPRESSION: Patient has met 2/2 short term goals and 0/5 long term goals with ability to complete HEP and improvement in symptoms.  Remaining goals not met due to continued deficits in  strength, ROM, activity tolerance, posture, and functional mobility. Patient has made good progress toward remaining goals and seems to be making good progress with UE and postural strengthening. Extending POC 1-2 x /week for 6 weeks to continue to work on posture and core/UE/grip strength as deficits continue to remain. Patient will continue to benefit from skilled physical therapy in order to improve function and reduce impairment.     OBJECTIVE IMPAIRMENTS: decreased activity tolerance, decreased endurance, decreased mobility, decreased ROM, decreased strength, hypomobility, increased muscle spasms, impaired flexibility, improper body mechanics, postural dysfunction, and pain.   ACTIVITY LIMITATIONS: carrying, lifting, bending, and caring for others  PARTICIPATION LIMITATIONS: meal prep, cleaning, community activity, occupation, and yard work  PERSONAL FACTORS: Profession and Time since onset of injury/illness/exacerbation are also affecting patient's functional outcome.   REHAB POTENTIAL: Good  CLINICAL DECISION MAKING: Stable/uncomplicated  EVALUATION COMPLEXITY: Low   GOALS: Goals reviewed with patient? Yes  SHORT TERM GOALS: Target date: 04/26/2023    Patient will be independent with HEP in order to improve functional outcomes. Baseline: Goal status:  MET  2.  Patient will report at least 25% improvement in symptoms for improved quality of life. Baseline: 07/09/23:  maybe 50% in strength Goal status: MET    LONG TERM GOALS: Target date: 05/10/2023  Patient will report at least 75% improvement in symptoms for improved quality of life. Baseline:  Goal status: INITIAL  2.  Patient will improve FOTO score to predicted outcomes in order to indicate improved tolerance to activity. Baseline:  Goal status: INITIAL  3.  Patient will demonstrate at least 25% improvement in cervical ROM in all restricted planes for improved ability to move head while working. Baseline: see above Goal status: INITIAL  4.  Patient will report centralized cervical symptoms no greater than 2/10 for improved ability to use L hand at home and work.  Baseline: current 4/10 Goal status: INITIAL  5.  Patient will demonstrate grade of 5/5 MMT grade in all tested musculature as evidence of improved strength to assist with lifting and gripping. Baseline: see above Goal status: INITIAL    PLAN:  PT FREQUENCY: 1-2x/week  PT DURATION: 6 weeks  PLANNED INTERVENTIONS: Therapeutic exercises, Therapeutic activity, Neuromuscular re-education, Balance training, Gait training, Patient/Family education, Joint manipulation, Joint mobilization, Stair training, Orthotic/Fit training, DME instructions, Aquatic Therapy, Dry Needling, Electrical stimulation, Spinal manipulation, Spinal mobilization, Cryotherapy, Moist heat, Compression bandaging, scar mobilization, Splintting, Taping, Traction, Ultrasound, Ionotophoresis 4mg /ml Dexamethasone, and Manual therapy  PLAN FOR NEXT SESSION: f/u with HEP; possibly DN, manual cervical/thoracic spine/ ribs for mobility/symptoms; postural strength, L shoulder strength, grip strength; spinal mobility, core strength   Reola Mosher Consuella Scurlock, PT 07/09/2023, 10:27 AM

## 2023-07-10 ENCOUNTER — Other Ambulatory Visit (HOSPITAL_COMMUNITY): Payer: Self-pay

## 2023-07-11 ENCOUNTER — Other Ambulatory Visit (HOSPITAL_COMMUNITY): Payer: Self-pay

## 2023-07-11 MED ORDER — OLMESARTAN MEDOXOMIL 20 MG PO TABS
20.0000 mg | ORAL_TABLET | Freq: Every day | ORAL | 0 refills | Status: DC
  Filled 2023-07-11: qty 90, 90d supply, fill #0

## 2023-07-12 ENCOUNTER — Other Ambulatory Visit (HOSPITAL_COMMUNITY): Payer: Self-pay

## 2023-07-14 ENCOUNTER — Encounter (HOSPITAL_BASED_OUTPATIENT_CLINIC_OR_DEPARTMENT_OTHER): Payer: Self-pay | Admitting: Physical Therapy

## 2023-07-14 ENCOUNTER — Ambulatory Visit (HOSPITAL_BASED_OUTPATIENT_CLINIC_OR_DEPARTMENT_OTHER): Payer: 59 | Admitting: Physical Therapy

## 2023-07-14 DIAGNOSIS — M542 Cervicalgia: Secondary | ICD-10-CM

## 2023-07-14 DIAGNOSIS — R29898 Other symptoms and signs involving the musculoskeletal system: Secondary | ICD-10-CM

## 2023-07-14 NOTE — Therapy (Signed)
OUTPATIENT PHYSICAL THERAPY TREATMENT   Patient Name: [name] MRN: [id number] DOB:10-19-1960, 62 y.o., male Today's Date: 07/14/2023    END OF SESSION:  PT End of Session - 07/14/23 1433     Visit Number 8    Number of Visits 28    Date for PT Re-Evaluation 08/20/23    Authorization Type Primary: [employer] Secondary: generic [employer]    PT Start Time 1433    PT Stop Time 1520    PT Time Calculation (min) 47 min    Activity Tolerance Patient tolerated treatment well    Behavior During Therapy [facility] for tasks assessed/performed             Past Medical History:  Diagnosis Date   Hyperlipidemia    Hypertension    Prostate cancer [facility])    Past Surgical History:  Procedure Laterality Date   BACK SURGERY  2010   COLONOSCOPY N/A 07/23/2016   Procedure: COLONOSCOPY;  Surgeon: Corbin Ade, MD;  Location: [facility];  Service: Endoscopy;  Laterality: N/A;  10:00 AM   LYMPHADENECTOMY Bilateral 09/10/2020   Procedure: LYMPHADENECTOMY;  Surgeon: Sebastian Ache, MD;  Location: [facility];  Service: Urology;  Laterality: Bilateral;   PROSTATE BIOPSY     ROBOT ASSISTED LAPAROSCOPIC RADICAL PROSTATECTOMY N/A 09/10/2020   Procedure: XI ROBOTIC ASSISTED LAPAROSCOPIC RADICAL PROSTATECTOMY;  Surgeon: Sebastian Ache, MD;  Location: [facility];  Service: Urology;  Laterality: N/A;  3 HRS   Patient Active Problem List   Diagnosis Date Noted   Acute foot pain, right 02/28/2023   Prostate cancer (HCC) 09/10/2020   Elevated PSA 06/17/2020    PCP: Assunta Found MD  REFERRING PROVIDER: Tressie Stalker, MD  REFERRING DIAG: M54.2 (ICD-10-CM) - Cervicalgia  THERAPY DIAG:  Cervicalgia  Other symptoms and signs involving the musculoskeletal system  Rationale for Evaluation and Treatment: Rehabilitation  ONSET DATE: Earlier this year  SUBJECTIVE:                                                                                                                                                                                                          SUBJECTIVE STATEMENT: Patient states everything is going well. Workouts been good. Trying to increase weights as able. Still getting the catch in the neck. Especially issues with rotation with flexion/extension.   EVAL: Patient states neck pain and numbness that began earlier this year. Had shooting pain down back of arm from neck. He notes decreased grip strength in L hand and decreased dexterity while tying sutures. Maybe  a little in R hand on strength loss. Symptoms follow a C8 dermatome in LUE. Symptoms began with insidious onset. Neck pain intermittent but neuropathy symptoms present in L arm C8 dermatome pretty constant. Started using robots for surgery in November 2023 when necessary/able. No L shoulder injury.   PERTINENT HISTORY:  Hx prostate cancer, HTN, HLD  PAIN:  Are you having pain? Yes: NPRS scale: 5/10 Pain location: neck and into LUE Pain description: achy Aggravating factors: intermittent Relieving factors: none  PRECAUTIONS: None  WEIGHT BEARING RESTRICTIONS: No  FALLS:  Has patient fallen in last 6 months? No   OCCUPATION: Surgeon  PLOF: Independent  PATIENT GOALS: get PT eval   OBJECTIVE:   DIAGNOSTIC FINDINGS:  MRI 01/27/23 IMPRESSION: 1. Cervical spondylosis as outlined and with findings most notably as follows. 2. At C7-T1, left-sided disc osteophyte ridge contributes to severe left neural foraminal narrowing. 3. At C6-C7, there is moderate-to-advanced disc degeneration (with mild degenerative endplate edema). Multifactorial mild-to-moderate spinal canal stenosis. Severe bilateral neural foraminal narrowing. 4. At C5-C6, there is moderate-to-advanced disc degeneration (with mild degenerative endplate edema). A posterior disc osteophyte complex effaces the ventral thecal sac, mildly flattening the ventral aspect of the spinal cord. However, the dorsal CSF space  is maintained within the spinal canal. Multifactorial bilateral neural foraminal narrowing (severe right, moderate/severe left). 5. At C3-C4, a posterior disc osteophyte complex and superimposed central disc protrusion efface the ventral thecal sac, mildly flattening the ventral aspect of the spinal cord. However, the dorsal CSF space is maintained within the spinal canal. Multifactorial bilateral neural foraminal narrowing also present at this level (severe right, moderate left).  PATIENT SURVEYS: FOTO complete next session- time limited  COGNITION: Overall cognitive status: Within functional limits for tasks assessed  SENSATION:Light touch: decreased L - C8  POSTURE: rounded shoulders, forward head, and increased thoracic kyphosis  PALPATION: TTP grossly cervical paraspinals R>L, hypomobile R and L UPA  throughout c/sp, trigger point L UT, hypomobile 1st rib bilaterally L>R with symptoms into L UE   CERVICAL ROM:   Active ROM A/PROM (deg) eval AROM 07/09/23  Flexion 50 32 40  Extension 30 * 29 16  Right lateral flexion 12 12 12   Left lateral flexion 15 5 10   Right rotation 29 27 40  Left rotation 34 25 30   (Blank rows = not tested) *=symptoms  UPPER EXTREMITY ROM: WFL for tasks assessed    UPPER EXTREMITY MMT:  MMT Right eval Left eval Right 06/15/23 Left 06/15/23 Right 07/09/23 Left 07/09/23  Shoulder flexion 5 4 4+ 4 4 4+  Shoulder extension        Shoulder abduction 5 4 4+ 4 4+ 4+  Shoulder adduction        Shoulder extension        Shoulder internal rotation 5 5 5 5 5 5   Shoulder external rotation 5 4+ 5 4+ 5 5  Middle trapezius        Lower trapezius        Elbow flexion 5 5 5  4+ 4+ 4+  Elbow extension 5 5 5  4+ 5 4+  Wrist flexion        Wrist extension        Wrist ulnar deviation        Wrist radial deviation        Wrist pronation        Wrist supination        Grip strength WFL decreased decreased decreased decreased decreased   (  Blank rows =  not tested)  CERVICAL SPECIAL TESTS:  Spurling's test: Positive and Distraction test: Negative    TODAY'S TREATMENT:                                                                                                                              DATE:  07/14/23 Ab set 20 x 5 second holds Supine march with ab set 2 x 10 Bridge  3 x 10  Abdominal isometric green ball 10 x 3- 5 second holds Palof press 10# 2 x 10  Chops/lifts 10# 2 x 10 each SA activation 2 x 10 at counter  07/09/23 UBE level 6, 2 forward 2 retro Reassessment Standing shoulder abduction 5# 2 x 10 Standing shoulder scaption 5# 2 x 10 Prone shoulder extension 3# 2 x 10  Prone shoulder horizontal abduction 3# 2 x 10  Standing hammer curl 8# 2 x 10  Standing pronated curls 5# 2 x 10   06/30/23 UBE level 6, 2 forward 2 retro Pulldown 40# 4 x 10 Seated low row machine 25# 3 x 10 Bicep curl machine 40# 3 x 10  Cable shoulder ER at 90 abduction 5# 1 x 8 bilateral Cable shoulder IR at 90 abduction 5# 1 x 8 bilateral   06/24/23 UBE level 6, 2 forward 2 retro Seated Row 25# 3 x 10  Seated lat pull down 20# 3 x 10 Seated shoulder press 10# 3 x 10  Seated low row machine 25# 3 x 10 Chest press 25# 3 x 10    06/15/23 Reassessment Cable bicep curl 15# 2 x 10  Cable bilateral shoulder extension 15# 2 x 10 Cable elbow extension 10# 2x 10    05/04/23 Manual: supine cervical retraction x 30, ret-ext x 5 Thoracic extension over chair 10 x 5 second holds Scapular retraction with GH ER RTB 2 x 10 Standing horizontal abduction GTB 2x10 Standing shoulder PNF D2 GTB 2 x 10  04/18/23 Seated cervical retractions 2 x 10  Seated scap retractions 2 x 10 Doorway pec stretch 3 x 30 second holds Supine cervical retractation 2x 10   Manual: R and L UPA grade III to mid/lower cervical spine, STM L UT, L first rib inferior glides grade III  Manual: STM to L UT pre and post dry needling for trigger point identification and muscular  relaxation.  Trigger Point Dry-Needling  Treatment instructions: Expect mild to moderate muscle soreness. S/S of pneumothorax if dry needled over a lung field, and to seek immediate medical attention should they occur. Patient verbalized understanding of these instructions and education.  Patient Consent Given: Yes Education handout provided: Yes Muscles treated: L UT Electrical stimulation performed: No Parameters: N/A Treatment response/outcome: twitch response elicited  Seated UT stretch 3 x 30 second holds L UT Scapular retraction with GH ER RTB 2 x 10 Standing row RTB 2 x 10   04/12/23 Supine cervical retractation 2x 10   Seated  cervical retractions 2 x 10  Seated scap retractions 2 x 10 Doorway pec stretch 3 x 30 second holds  PATIENT EDUCATION:  Education details: 04/18/23: HEP, DN;  EVAL: Patient educated on exam findings, POC, scope of PT, HEP, and DN, posture. 10/12: reassessment, POC, HEP Person educated: Patient Education method: Explanation, Demonstration, and Handouts Education comprehension: verbalized understanding, returned demonstration, verbal cues required, and tactile cues required  HOME EXERCISE PROGRAM: Access Code: [email or web address] URL: [email or web address]  - Standing Bicep Curls with Resistance  - 1 x daily - 7 x weekly - 3 sets - 10 reps - Triceps Extension   - 1 x daily - 7 x weekly - 3 sets - 10 reps - Shoulder extension with resistance - Neutral  - 1 x daily - 7 x weekly - 3 sets - 10 reps  05/04/23- Standing Shoulder Horizontal Abduction with Resistance  - 1 x daily - 7 x weekly - 2 sets - 10 reps - Standing Shoulder Single Arm PNF D2 Flexion with Resistance (Mirrored)  - 1 x daily - 7 x weekly - 2 sets - 10 reps  04/18/23 - Shoulder External Rotation and Scapular Retraction with Resistance  - 1 x daily - 7 x weekly - 2 sets - 10 reps - Standing Shoulder Row with Anchored Resistance  - 1 x daily - 7 x weekly - 2 sets - 10 reps  Date:  04/12/2023 - Supine Chin Tuck  - 2-3 x daily - 7 x weekly - 2 sets - 10 reps - 2-3 second hold - Seated Cervical Retraction  - 4-5 x daily - 7 x weekly - 2 sets - 10 reps - Seated Scapular Retraction  - 4-5 x daily - 7 x weekly - 2 sets - 10 reps - Doorway Pec Stretch at 60 Elevation  - 3 x daily - 7 x weekly - 3 reps - 30 second hold  9/27 - Seated Row [employer]  - 1 x daily - 7 x weekly - 3 sets - 10 reps - Lat Pull Down  - 1 x daily - 7 x weekly - 3 sets - 10 reps  ASSESSMENT:  CLINICAL IMPRESSION: Continued with postural and core strengthening which is tolerated well. Patient will continue to benefit from skilled physical therapy in order to improve function and reduce impairment.     OBJECTIVE IMPAIRMENTS: decreased activity tolerance, decreased endurance, decreased mobility, decreased ROM, decreased strength, hypomobility, increased muscle spasms, impaired flexibility, improper body mechanics, postural dysfunction, and pain.   ACTIVITY LIMITATIONS: carrying, lifting, bending, and caring for others  PARTICIPATION LIMITATIONS: meal prep, cleaning, community activity, occupation, and yard work  PERSONAL FACTORS: Profession and Time since onset of injury/illness/exacerbation are also affecting patient's functional outcome.   REHAB POTENTIAL: Good  CLINICAL DECISION MAKING: Stable/uncomplicated  EVALUATION COMPLEXITY: Low   GOALS: Goals reviewed with patient? Yes  SHORT TERM GOALS: Target date: 04/26/2023    Patient will be independent with HEP in order to improve functional outcomes. Baseline: Goal status: MET  2.  Patient will report at least 25% improvement in symptoms for improved quality of life. Baseline: 07/09/23:  maybe 50% in strength Goal status: MET    LONG TERM GOALS: Target date: 05/10/2023    Patient will report at least 75% improvement in symptoms for improved quality of life. Baseline:  Goal status: INITIAL  2.  Patient will improve FOTO score  to predicted outcomes in order to indicate improved tolerance to activity. Baseline:  Goal status: INITIAL  3.  Patient will demonstrate at least 25% improvement in cervical ROM in all restricted planes for improved ability to move head while working. Baseline: see above Goal status: INITIAL  4.  Patient will report centralized cervical symptoms no greater than 2/10 for improved ability to use L hand at home and work.  Baseline: current 4/10 Goal status: INITIAL  5.  Patient will demonstrate grade of 5/5 MMT grade in all tested musculature as evidence of improved strength to assist with lifting and gripping. Baseline: see above Goal status: INITIAL    PLAN:  PT FREQUENCY: 1-2x/week  PT DURATION: 6 weeks  PLANNED INTERVENTIONS: Therapeutic exercises, Therapeutic activity, Neuromuscular re-education, Balance training, Gait training, Patient/Family education, Joint manipulation, Joint mobilization, Stair training, Orthotic/Fit training, DME instructions, Aquatic Therapy, Dry Needling, Electrical stimulation, Spinal manipulation, Spinal mobilization, Cryotherapy, Moist heat, Compression bandaging, scar mobilization, Splintting, Taping, Traction, Ultrasound, Ionotophoresis 4mg /ml Dexamethasone, and Manual therapy  PLAN FOR NEXT SESSION: f/u with HEP; possibly DN, manual cervical/thoracic spine/ ribs for mobility/symptoms; postural strength, L shoulder strength, grip strength; spinal mobility, core strength   Reola Mosher Rocio Wolak, PT 07/14/2023, 3:24 PM

## 2023-07-16 ENCOUNTER — Other Ambulatory Visit (HOSPITAL_COMMUNITY): Payer: Self-pay

## 2023-07-20 ENCOUNTER — Other Ambulatory Visit: Payer: Self-pay

## 2023-07-23 ENCOUNTER — Ambulatory Visit (HOSPITAL_BASED_OUTPATIENT_CLINIC_OR_DEPARTMENT_OTHER): Payer: 59 | Admitting: Physical Therapy

## 2023-07-23 ENCOUNTER — Encounter (HOSPITAL_BASED_OUTPATIENT_CLINIC_OR_DEPARTMENT_OTHER): Payer: Self-pay | Admitting: Physical Therapy

## 2023-07-23 DIAGNOSIS — M542 Cervicalgia: Secondary | ICD-10-CM

## 2023-07-23 DIAGNOSIS — R29898 Other symptoms and signs involving the musculoskeletal system: Secondary | ICD-10-CM

## 2023-07-23 NOTE — Therapy (Signed)
OUTPATIENT PHYSICAL THERAPY TREATMENT   Patient Name: [name] MRN: [id number] DOB:1961-05-18, 62 y.o., male Today's Date: 07/23/2023    END OF SESSION:  PT End of Session - 07/23/23 1043     Visit Number 9    Number of Visits 28    Date for PT Re-Evaluation 08/20/23    Authorization Type Primary: [employer] Secondary: generic [employer]    PT Start Time 1043    PT Stop Time 1125    PT Time Calculation (min) 42 min    Activity Tolerance Patient tolerated treatment well    Behavior During Therapy WFL for tasks assessed/performed             Past Medical History:  Diagnosis Date   Hyperlipidemia    Hypertension    Prostate cancer [facility])    Past Surgical History:  Procedure Laterality Date   BACK SURGERY  2010   COLONOSCOPY N/A 07/23/2016   Procedure: COLONOSCOPY;  Surgeon: Corbin Ade, MD;  Location: [facility];  Service: Endoscopy;  Laterality: N/A;  10:00 AM   LYMPHADENECTOMY Bilateral 09/10/2020   Procedure: LYMPHADENECTOMY;  Surgeon: Sebastian Ache, MD;  Location: [facility];  Service: Urology;  Laterality: Bilateral;   PROSTATE BIOPSY     ROBOT ASSISTED LAPAROSCOPIC RADICAL PROSTATECTOMY N/A 09/10/2020   Procedure: XI ROBOTIC ASSISTED LAPAROSCOPIC RADICAL PROSTATECTOMY;  Surgeon: Sebastian Ache, MD;  Location: [facility];  Service: Urology;  Laterality: N/A;  3 HRS   Patient Active Problem List   Diagnosis Date Noted   Acute foot pain, right 02/28/2023   Prostate cancer (HCC) 09/10/2020   Elevated PSA 06/17/2020    PCP: Assunta Found MD  REFERRING PROVIDER: Tressie Stalker, MD  REFERRING DIAG: M54.2 (ICD-10-CM) - Cervicalgia  THERAPY DIAG:  Cervicalgia  Other symptoms and signs involving the musculoskeletal system  Rationale for Evaluation and Treatment: Rehabilitation  ONSET DATE: Earlier this year  SUBJECTIVE:                                                                                                                                                                                                          SUBJECTIVE STATEMENT: Patient states only able to do core workouts a couple times last week. Symptoms are about the same. Able to gradually increase with weights/reps/sets.  Weakness and clumsiness in L hand.   EVAL: Patient states neck pain and numbness that began earlier this year. Had shooting pain down back of arm from neck. He notes decreased grip strength in L hand and decreased dexterity while  tying sutures. Maybe a little in R hand on strength loss. Symptoms follow a C8 dermatome in LUE. Symptoms began with insidious onset. Neck pain intermittent but neuropathy symptoms present in L arm C8 dermatome pretty constant. Started using robots for surgery in November 2023 when necessary/able. No L shoulder injury.   PERTINENT HISTORY:  Hx prostate cancer, HTN, HLD  PAIN:  Are you having pain? Yes: NPRS scale: 5/10 Pain location: neck and into LUE Pain description: achy Aggravating factors: intermittent Relieving factors: none  PRECAUTIONS: None  WEIGHT BEARING RESTRICTIONS: No  FALLS:  Has patient fallen in last 6 months? No   OCCUPATION: Surgeon  PLOF: Independent  PATIENT GOALS: get PT eval   OBJECTIVE:   DIAGNOSTIC FINDINGS:  MRI 01/27/23 IMPRESSION: 1. Cervical spondylosis as outlined and with findings most notably as follows. 2. At C7-T1, left-sided disc osteophyte ridge contributes to severe left neural foraminal narrowing. 3. At C6-C7, there is moderate-to-advanced disc degeneration (with mild degenerative endplate edema). Multifactorial mild-to-moderate spinal canal stenosis. Severe bilateral neural foraminal narrowing. 4. At C5-C6, there is moderate-to-advanced disc degeneration (with mild degenerative endplate edema). A posterior disc osteophyte complex effaces the ventral thecal sac, mildly flattening the ventral aspect of the spinal cord. However, the dorsal CSF space is maintained  within the spinal canal. Multifactorial bilateral neural foraminal narrowing (severe right, moderate/severe left). 5. At C3-C4, a posterior disc osteophyte complex and superimposed central disc protrusion efface the ventral thecal sac, mildly flattening the ventral aspect of the spinal cord. However, the dorsal CSF space is maintained within the spinal canal. Multifactorial bilateral neural foraminal narrowing also present at this level (severe right, moderate left).  PATIENT SURVEYS: FOTO complete next session- time limited  COGNITION: Overall cognitive status: Within functional limits for tasks assessed  SENSATION:Light touch: decreased L - C8  POSTURE: rounded shoulders, forward head, and increased thoracic kyphosis  PALPATION: TTP grossly cervical paraspinals R>L, hypomobile R and L UPA  throughout c/sp, trigger point L UT, hypomobile 1st rib bilaterally L>R with symptoms into L UE   CERVICAL ROM:   Active ROM A/PROM (deg) eval AROM 07/09/23  Flexion 50 32 40  Extension 30 * 29 16  Right lateral flexion 12 12 12   Left lateral flexion 15 5 10   Right rotation 29 27 40  Left rotation 34 25 30   (Blank rows = not tested) *=symptoms  UPPER EXTREMITY ROM: WFL for tasks assessed    UPPER EXTREMITY MMT:  MMT Right eval Left eval Right 06/15/23 Left 06/15/23 Right 07/09/23 Left 07/09/23  Shoulder flexion 5 4 4+ 4 4 4+  Shoulder extension        Shoulder abduction 5 4 4+ 4 4+ 4+  Shoulder adduction        Shoulder extension        Shoulder internal rotation 5 5 5 5 5 5   Shoulder external rotation 5 4+ 5 4+ 5 5  Middle trapezius        Lower trapezius        Elbow flexion 5 5 5  4+ 4+ 4+  Elbow extension 5 5 5  4+ 5 4+  Wrist flexion        Wrist extension        Wrist ulnar deviation        Wrist radial deviation        Wrist pronation        Wrist supination        Grip strength WFL decreased decreased decreased  decreased decreased   (Blank rows = not  tested)  CERVICAL SPECIAL TESTS:  Spurling's test: Positive and Distraction test: Negative    TODAY'S TREATMENT:                                                                                                                              DATE:  07/23/23 Cable ER in neutral 5# 3 x 10 bilateral  Cable IR in neutral 5# 3 x 10 bilateral  Cable pec 5# each side 2 x 10  Cable shoulder ER at 90 abduction 5# 2 x 8 bilateral Cable shoulder IR at 90 abduction 5# 2 x 8 bilateral Cable unilateral bent row 10# 3 x 10   07/14/23 Ab set 20 x 5 second holds Supine march with ab set 2 x 10 Bridge  3 x 10  Abdominal isometric green ball 10 x 3- 5 second holds Palof press 10# 2 x 10  Chops/lifts 10# 2 x 10 each SA activation 2 x 10 at counter  07/09/23 UBE level 6, 2 forward 2 retro Reassessment Standing shoulder abduction 5# 2 x 10 Standing shoulder scaption 5# 2 x 10 Prone shoulder extension 3# 2 x 10  Prone shoulder horizontal abduction 3# 2 x 10  Standing hammer curl 8# 2 x 10  Standing pronated curls 5# 2 x 10   06/30/23 UBE level 6, 2 forward 2 retro Pulldown 40# 4 x 10 Seated low row machine 25# 3 x 10 Bicep curl machine 40# 3 x 10  Cable shoulder ER at 90 abduction 5# 1 x 8 bilateral Cable shoulder IR at 90 abduction 5# 1 x 8 bilateral   06/24/23 UBE level 6, 2 forward 2 retro Seated Row 25# 3 x 10  Seated lat pull down 20# 3 x 10 Seated shoulder press 10# 3 x 10  Seated low row machine 25# 3 x 10 Chest press 25# 3 x 10    06/15/23 Reassessment Cable bicep curl 15# 2 x 10  Cable bilateral shoulder extension 15# 2 x 10 Cable elbow extension 10# 2x 10    05/04/23 Manual: supine cervical retraction x 30, ret-ext x 5 Thoracic extension over chair 10 x 5 second holds Scapular retraction with GH ER RTB 2 x 10 Standing horizontal abduction GTB 2x10 Standing shoulder PNF D2 GTB 2 x 10  04/18/23 Seated cervical retractions 2 x 10  Seated scap retractions 2 x 10 Doorway  pec stretch 3 x 30 second holds Supine cervical retractation 2x 10   Manual: R and L UPA grade III to mid/lower cervical spine, STM L UT, L first rib inferior glides grade III  Manual: STM to L UT pre and post dry needling for trigger point identification and muscular relaxation.  Trigger Point Dry-Needling  Treatment instructions: Expect mild to moderate muscle soreness. S/S of pneumothorax if dry needled over a lung field, and to seek immediate medical attention should they occur. Patient verbalized understanding  of these instructions and education.  Patient Consent Given: Yes Education handout provided: Yes Muscles treated: L UT Electrical stimulation performed: No Parameters: N/A Treatment response/outcome: twitch response elicited  Seated UT stretch 3 x 30 second holds L UT Scapular retraction with GH ER RTB 2 x 10 Standing row RTB 2 x 10   04/12/23 Supine cervical retractation 2x 10   Seated cervical retractions 2 x 10  Seated scap retractions 2 x 10 Doorway pec stretch 3 x 30 second holds  PATIENT EDUCATION:  Education details: 04/18/23: HEP, DN;  EVAL: Patient educated on exam findings, POC, scope of PT, HEP, and DN, posture. 10/12: reassessment, POC, HEP Person educated: Patient Education method: Explanation, Demonstration, and Handouts Education comprehension: verbalized understanding, returned demonstration, verbal cues required, and tactile cues required  HOME EXERCISE PROGRAM: Access Code: [email or web address] URL: [email or web address]  - Standing Bicep Curls with Resistance  - 1 x daily - 7 x weekly - 3 sets - 10 reps - Triceps Extension   - 1 x daily - 7 x weekly - 3 sets - 10 reps - Shoulder extension with resistance - Neutral  - 1 x daily - 7 x weekly - 3 sets - 10 reps  05/04/23- Standing Shoulder Horizontal Abduction with Resistance  - 1 x daily - 7 x weekly - 2 sets - 10 reps - Standing Shoulder Single Arm PNF D2 Flexion with Resistance (Mirrored)  - 1 x  daily - 7 x weekly - 2 sets - 10 reps  04/18/23 - Shoulder External Rotation and Scapular Retraction with Resistance  - 1 x daily - 7 x weekly - 2 sets - 10 reps - Standing Shoulder Row with Anchored Resistance  - 1 x daily - 7 x weekly - 2 sets - 10 reps  Date: 04/12/2023 - Supine Chin Tuck  - 2-3 x daily - 7 x weekly - 2 sets - 10 reps - 2-3 second hold - Seated Cervical Retraction  - 4-5 x daily - 7 x weekly - 2 sets - 10 reps - Seated Scapular Retraction  - 4-5 x daily - 7 x weekly - 2 sets - 10 reps - Doorway Pec Stretch at 60 Elevation  - 3 x daily - 7 x weekly - 3 reps - 30 second hold  9/27 - Seated Row [employer]  - 1 x daily - 7 x weekly - 3 sets - 10 reps - Lat Pull Down  - 1 x daily - 7 x weekly - 3 sets - 10 reps  ASSESSMENT:  CLINICAL IMPRESSION: Continued with postural and periscap strengthening strengthening which is tolerated well.Intermittent cueing for mechanics with good/fair carry over.  Patient will continue to benefit from skilled physical therapy in order to improve function and reduce impairment.     OBJECTIVE IMPAIRMENTS: decreased activity tolerance, decreased endurance, decreased mobility, decreased ROM, decreased strength, hypomobility, increased muscle spasms, impaired flexibility, improper body mechanics, postural dysfunction, and pain.   ACTIVITY LIMITATIONS: carrying, lifting, bending, and caring for others  PARTICIPATION LIMITATIONS: meal prep, cleaning, community activity, occupation, and yard work  PERSONAL FACTORS: Profession and Time since onset of injury/illness/exacerbation are also affecting patient's functional outcome.   REHAB POTENTIAL: Good  CLINICAL DECISION MAKING: Stable/uncomplicated  EVALUATION COMPLEXITY: Low   GOALS: Goals reviewed with patient? Yes  SHORT TERM GOALS: Target date: 04/26/2023    Patient will be independent with HEP in order to improve functional outcomes. Baseline: Goal status: MET  2.  Patient will  report at  least 25% improvement in symptoms for improved quality of life. Baseline: 07/09/23:  maybe 50% in strength Goal status: MET    LONG TERM GOALS: Target date: 05/10/2023    Patient will report at least 75% improvement in symptoms for improved quality of life. Baseline:  Goal status: INITIAL  2.  Patient will improve FOTO score to predicted outcomes in order to indicate improved tolerance to activity. Baseline:  Goal status: INITIAL  3.  Patient will demonstrate at least 25% improvement in cervical ROM in all restricted planes for improved ability to move head while working. Baseline: see above Goal status: INITIAL  4.  Patient will report centralized cervical symptoms no greater than 2/10 for improved ability to use L hand at home and work.  Baseline: current 4/10 Goal status: INITIAL  5.  Patient will demonstrate grade of 5/5 MMT grade in all tested musculature as evidence of improved strength to assist with lifting and gripping. Baseline: see above Goal status: INITIAL    PLAN:  PT FREQUENCY: 1-2x/week  PT DURATION: 6 weeks  PLANNED INTERVENTIONS: Therapeutic exercises, Therapeutic activity, Neuromuscular re-education, Balance training, Gait training, Patient/Family education, Joint manipulation, Joint mobilization, Stair training, Orthotic/Fit training, DME instructions, Aquatic Therapy, Dry Needling, Electrical stimulation, Spinal manipulation, Spinal mobilization, Cryotherapy, Moist heat, Compression bandaging, scar mobilization, Splintting, Taping, Traction, Ultrasound, Ionotophoresis 4mg /ml Dexamethasone, and Manual therapy  PLAN FOR NEXT SESSION: f/u with HEP; possibly DN, manual cervical/thoracic spine/ ribs for mobility/symptoms; postural strength, L shoulder strength, grip strength; spinal mobility, core strength   Reola Mosher Otto Felkins, PT 07/23/2023, 11:28 AM

## 2023-07-29 ENCOUNTER — Other Ambulatory Visit (HOSPITAL_COMMUNITY): Payer: Self-pay

## 2023-07-29 MED ORDER — OLMESARTAN MEDOXOMIL 20 MG PO TABS
20.0000 mg | ORAL_TABLET | Freq: Every day | ORAL | 2 refills | Status: DC
  Filled 2023-07-29 – 2023-10-05 (×2): qty 90, 90d supply, fill #0
  Filled 2024-01-01: qty 90, 90d supply, fill #1
  Filled 2024-03-25: qty 90, 90d supply, fill #2

## 2023-07-29 MED ORDER — ROSUVASTATIN CALCIUM 10 MG PO TABS
10.0000 mg | ORAL_TABLET | Freq: Every day | ORAL | 2 refills | Status: DC
  Filled 2023-07-29 – 2023-10-23 (×2): qty 90, 90d supply, fill #0
  Filled 2024-01-22: qty 90, 90d supply, fill #1
  Filled 2024-04-19: qty 90, 90d supply, fill #2

## 2023-07-29 MED ORDER — ROSUVASTATIN CALCIUM 10 MG PO TABS
10.0000 mg | ORAL_TABLET | Freq: Every day | ORAL | 0 refills | Status: DC
  Filled 2023-07-29: qty 90, 90d supply, fill #0

## 2023-08-02 ENCOUNTER — Other Ambulatory Visit (HOSPITAL_COMMUNITY): Payer: Self-pay

## 2023-08-04 ENCOUNTER — Encounter (HOSPITAL_BASED_OUTPATIENT_CLINIC_OR_DEPARTMENT_OTHER): Payer: Self-pay | Admitting: Physical Therapy

## 2023-08-04 ENCOUNTER — Ambulatory Visit (HOSPITAL_BASED_OUTPATIENT_CLINIC_OR_DEPARTMENT_OTHER): Payer: 59 | Attending: Physical Medicine and Rehabilitation | Admitting: Physical Therapy

## 2023-08-04 DIAGNOSIS — M542 Cervicalgia: Secondary | ICD-10-CM | POA: Insufficient documentation

## 2023-08-04 DIAGNOSIS — R29898 Other symptoms and signs involving the musculoskeletal system: Secondary | ICD-10-CM | POA: Diagnosis not present

## 2023-08-04 NOTE — Therapy (Signed)
OUTPATIENT PHYSICAL THERAPY TREATMENT   Patient Name: [name] MRN: [id number] DOB:31-Dec-1960, 62 y.o., male Today's Date: 08/04/2023    END OF SESSION:  PT End of Session - 08/04/23 1422     Visit Number 10    Number of Visits 28    Date for PT Re-Evaluation 08/20/23    Authorization Type Primary: [employer] Secondary: generic [employer]    PT Start Time 1422    PT Stop Time 1502    PT Time Calculation (min) 40 min    Activity Tolerance Patient tolerated treatment well    Behavior During Therapy [facility] for tasks assessed/performed             Past Medical History:  Diagnosis Date   Hyperlipidemia    Hypertension    Prostate cancer [facility])    Past Surgical History:  Procedure Laterality Date   BACK SURGERY  2010   COLONOSCOPY N/A 07/23/2016   Procedure: COLONOSCOPY;  Surgeon: Corbin Ade, MD;  Location: [facility];  Service: Endoscopy;  Laterality: N/A;  10:00 AM   LYMPHADENECTOMY Bilateral 09/10/2020   Procedure: LYMPHADENECTOMY;  Surgeon: Sebastian Ache, MD;  Location: [facility] ORS;  Service: Urology;  Laterality: Bilateral;   PROSTATE BIOPSY     ROBOT ASSISTED LAPAROSCOPIC RADICAL PROSTATECTOMY N/A 09/10/2020   Procedure: XI ROBOTIC ASSISTED LAPAROSCOPIC RADICAL PROSTATECTOMY;  Surgeon: Sebastian Ache, MD;  Location: [facility] ORS;  Service: Urology;  Laterality: N/A;  3 HRS   Patient Active Problem List   Diagnosis Date Noted   Acute foot pain, right 02/28/2023   Prostate cancer (HCC) 09/10/2020   Elevated PSA 06/17/2020    PCP: Assunta Found MD  REFERRING PROVIDER: Tressie Stalker, MD  REFERRING DIAG: M54.2 (ICD-10-CM) - Cervicalgia  THERAPY DIAG:  Cervicalgia  Other symptoms and signs involving the musculoskeletal system  Rationale for Evaluation and Treatment: Rehabilitation  ONSET DATE: Earlier this year  SUBJECTIVE:                                                                                                                                                                                                          SUBJECTIVE STATEMENT: Patient states he continues to exercise regularly.   EVAL: Patient states neck pain and numbness that began earlier this year. Had shooting pain down back of arm from neck. He notes decreased grip strength in L hand and decreased dexterity while tying sutures. Maybe a little in R hand on strength loss. Symptoms follow a C8 dermatome in LUE. Symptoms began with insidious onset. Neck  pain intermittent but neuropathy symptoms present in L arm C8 dermatome pretty constant. Started using robots for surgery in November 2023 when necessary/able. No L shoulder injury.   PERTINENT HISTORY:  Hx prostate cancer, HTN, HLD  PAIN:  Are you having pain? Yes: NPRS scale: 5/10 Pain location: neck and into LUE Pain description: achy Aggravating factors: intermittent Relieving factors: none  PRECAUTIONS: None  WEIGHT BEARING RESTRICTIONS: No  FALLS:  Has patient fallen in last 6 months? No   OCCUPATION: Surgeon  PLOF: Independent  PATIENT GOALS: get PT eval   OBJECTIVE:   DIAGNOSTIC FINDINGS:  MRI 01/27/23 IMPRESSION: 1. Cervical spondylosis as outlined and with findings most notably as follows. 2. At C7-T1, left-sided disc osteophyte ridge contributes to severe left neural foraminal narrowing. 3. At C6-C7, there is moderate-to-advanced disc degeneration (with mild degenerative endplate edema). Multifactorial mild-to-moderate spinal canal stenosis. Severe bilateral neural foraminal narrowing. 4. At C5-C6, there is moderate-to-advanced disc degeneration (with mild degenerative endplate edema). A posterior disc osteophyte complex effaces the ventral thecal sac, mildly flattening the ventral aspect of the spinal cord. However, the dorsal CSF space is maintained within the spinal canal. Multifactorial bilateral neural foraminal narrowing (severe right, moderate/severe left). 5. At C3-C4, a  posterior disc osteophyte complex and superimposed central disc protrusion efface the ventral thecal sac, mildly flattening the ventral aspect of the spinal cord. However, the dorsal CSF space is maintained within the spinal canal. Multifactorial bilateral neural foraminal narrowing also present at this level (severe right, moderate left).  PATIENT SURVEYS: FOTO complete next session- time limited  COGNITION: Overall cognitive status: Within functional limits for tasks assessed  SENSATION:Light touch: decreased L - C8  POSTURE: rounded shoulders, forward head, and increased thoracic kyphosis  PALPATION: TTP grossly cervical paraspinals R>L, hypomobile R and L UPA  throughout c/sp, trigger point L UT, hypomobile 1st rib bilaterally L>R with symptoms into L UE   CERVICAL ROM:   Active ROM A/PROM (deg) eval AROM 07/09/23  Flexion 50 32 40  Extension 30 * 29 16  Right lateral flexion 12 12 12   Left lateral flexion 15 5 10   Right rotation 29 27 40  Left rotation 34 25 30   (Blank rows = not tested) *=symptoms  UPPER EXTREMITY ROM: WFL for tasks assessed    UPPER EXTREMITY MMT:  MMT Right eval Left eval Right 06/15/23 Left 06/15/23 Right 07/09/23 Left 07/09/23  Shoulder flexion 5 4 4+ 4 4 4+  Shoulder extension        Shoulder abduction 5 4 4+ 4 4+ 4+  Shoulder adduction        Shoulder extension        Shoulder internal rotation 5 5 5 5 5 5   Shoulder external rotation 5 4+ 5 4+ 5 5  Middle trapezius        Lower trapezius        Elbow flexion 5 5 5  4+ 4+ 4+  Elbow extension 5 5 5  4+ 5 4+  Wrist flexion        Wrist extension        Wrist ulnar deviation        Wrist radial deviation        Wrist pronation        Wrist supination        Grip strength WFL decreased decreased decreased decreased decreased   (Blank rows = not tested)  CERVICAL SPECIAL TESTS:  Spurling's test: Positive and Distraction test: Negative  TODAY'S TREATMENT:                                                                                                                               DATE:  08/04/23 Supine cervical retractation 2x 10   Seated cervical retractions 2 x 10  Seated UT stretches 3 x 20 second holds Ulnar nerve flossing 2 x 10 Seated levator scapulae stretch 3 x 20 second holds Doorway pec stretch 3 x 20 second holds  07/23/23 Cable ER in neutral 5# 3 x 10 bilateral  Cable IR in neutral 5# 3 x 10 bilateral  Cable pec 5# each side 2 x 10  Cable shoulder ER at 90 abduction 5# 2 x 8 bilateral Cable shoulder IR at 90 abduction 5# 2 x 8 bilateral Cable unilateral bent row 10# 3 x 10   07/14/23 Ab set 20 x 5 second holds Supine march with ab set 2 x 10 Bridge  3 x 10  Abdominal isometric green ball 10 x 3- 5 second holds Palof press 10# 2 x 10  Chops/lifts 10# 2 x 10 each SA activation 2 x 10 at counter  07/09/23 UBE level 6, 2 forward 2 retro Reassessment Standing shoulder abduction 5# 2 x 10 Standing shoulder scaption 5# 2 x 10 Prone shoulder extension 3# 2 x 10  Prone shoulder horizontal abduction 3# 2 x 10  Standing hammer curl 8# 2 x 10  Standing pronated curls 5# 2 x 10   06/30/23 UBE level 6, 2 forward 2 retro Pulldown 40# 4 x 10 Seated low row machine 25# 3 x 10 Bicep curl machine 40# 3 x 10  Cable shoulder ER at 90 abduction 5# 1 x 8 bilateral Cable shoulder IR at 90 abduction 5# 1 x 8 bilateral   06/24/23 UBE level 6, 2 forward 2 retro Seated Row 25# 3 x 10  Seated lat pull down 20# 3 x 10 Seated shoulder press 10# 3 x 10  Seated low row machine 25# 3 x 10 Chest press 25# 3 x 10    06/15/23 Reassessment Cable bicep curl 15# 2 x 10  Cable bilateral shoulder extension 15# 2 x 10 Cable elbow extension 10# 2x 10    05/04/23 Manual: supine cervical retraction x 30, ret-ext x 5 Thoracic extension over chair 10 x 5 second holds Scapular retraction with GH ER RTB 2 x 10 Standing horizontal abduction GTB 2x10 Standing shoulder  PNF D2 GTB 2 x 10  04/18/23 Seated cervical retractions 2 x 10  Seated scap retractions 2 x 10 Doorway pec stretch 3 x 30 second holds Supine cervical retractation 2x 10   Manual: R and L UPA grade III to mid/lower cervical spine, STM L UT, L first rib inferior glides grade III  Manual: STM to L UT pre and post dry needling for trigger point identification and muscular relaxation.  Trigger Point Dry-Needling  Treatment instructions: Expect mild to moderate muscle  soreness. S/S of pneumothorax if dry needled over a lung field, and to seek immediate medical attention should they occur. Patient verbalized understanding of these instructions and education.  Patient Consent Given: Yes Education handout provided: Yes Muscles treated: L UT Electrical stimulation performed: No Parameters: N/A Treatment response/outcome: twitch response elicited  Seated UT stretch 3 x 30 second holds L UT Scapular retraction with GH ER RTB 2 x 10 Standing row RTB 2 x 10   04/12/23 Supine cervical retractation 2x 10   Seated cervical retractions 2 x 10  Seated scap retractions 2 x 10 Doorway pec stretch 3 x 30 second holds  PATIENT EDUCATION:  Education details: 04/18/23: HEP, DN;  EVAL: Patient educated on exam findings, POC, scope of PT, HEP, and DN, posture. 10/12: reassessment, POC, HEP Person educated: Patient Education method: Explanation, Demonstration, and Handouts Education comprehension: verbalized understanding, returned demonstration, verbal cues required, and tactile cues required  HOME EXERCISE PROGRAM: Access Code: [email or web address] URL: [email or web address]  - Standing Bicep Curls with Resistance  - 1 x daily - 7 x weekly - 3 sets - 10 reps - Triceps Extension   - 1 x daily - 7 x weekly - 3 sets - 10 reps - Shoulder extension with resistance - Neutral  - 1 x daily - 7 x weekly - 3 sets - 10 reps  05/04/23- Standing Shoulder Horizontal Abduction with Resistance  - 1 x daily - 7 x  weekly - 2 sets - 10 reps - Standing Shoulder Single Arm PNF D2 Flexion with Resistance (Mirrored)  - 1 x daily - 7 x weekly - 2 sets - 10 reps  04/18/23 - Shoulder External Rotation and Scapular Retraction with Resistance  - 1 x daily - 7 x weekly - 2 sets - 10 reps - Standing Shoulder Row with Anchored Resistance  - 1 x daily - 7 x weekly - 2 sets - 10 reps  Date: 04/12/2023 - Supine Chin Tuck  - 2-3 x daily - 7 x weekly - 2 sets - 10 reps - 2-3 second hold - Seated Cervical Retraction  - 4-5 x daily - 7 x weekly - 2 sets - 10 reps - Seated Scapular Retraction  - 4-5 x daily - 7 x weekly - 2 sets - 10 reps - Doorway Pec Stretch at 60 Elevation  - 3 x daily - 7 x weekly - 3 reps - 30 second hold  9/27 - Seated Row [employer]  - 1 x daily - 7 x weekly - 3 sets - 10 reps - Lat Pull Down  - 1 x daily - 7 x weekly - 3 sets - 10 reps  ASSESSMENT:  CLINICAL IMPRESSION: No improvement in symptoms with cervical retractions. States mild increase in L UE ulnar distrubution symptoms. Trial of ulnar nerve flossing with increase in symptoms.  Patient will continue to benefit from skilled physical therapy in order to improve function and reduce impairment.     OBJECTIVE IMPAIRMENTS: decreased activity tolerance, decreased endurance, decreased mobility, decreased ROM, decreased strength, hypomobility, increased muscle spasms, impaired flexibility, improper body mechanics, postural dysfunction, and pain.   ACTIVITY LIMITATIONS: carrying, lifting, bending, and caring for others  PARTICIPATION LIMITATIONS: meal prep, cleaning, community activity, occupation, and yard work  PERSONAL FACTORS: Profession and Time since onset of injury/illness/exacerbation are also affecting patient's functional outcome.   REHAB POTENTIAL: Good  CLINICAL DECISION MAKING: Stable/uncomplicated  EVALUATION COMPLEXITY: Low   GOALS: Goals reviewed with patient? Yes  SHORT TERM GOALS:  Target date: 04/26/2023     Patient will be independent with HEP in order to improve functional outcomes. Baseline: Goal status: MET  2.  Patient will report at least 25% improvement in symptoms for improved quality of life. Baseline: 07/09/23:  maybe 50% in strength Goal status: MET    LONG TERM GOALS: Target date: 05/10/2023    Patient will report at least 75% improvement in symptoms for improved quality of life. Baseline:  Goal status: INITIAL  2.  Patient will improve FOTO score to predicted outcomes in order to indicate improved tolerance to activity. Baseline:  Goal status: INITIAL  3.  Patient will demonstrate at least 25% improvement in cervical ROM in all restricted planes for improved ability to move head while working. Baseline: see above Goal status: INITIAL  4.  Patient will report centralized cervical symptoms no greater than 2/10 for improved ability to use L hand at home and work.  Baseline: current 4/10 Goal status: INITIAL  5.  Patient will demonstrate grade of 5/5 MMT grade in all tested musculature as evidence of improved strength to assist with lifting and gripping. Baseline: see above Goal status: INITIAL    PLAN:  PT FREQUENCY: 1-2x/week  PT DURATION: 6 weeks  PLANNED INTERVENTIONS: Therapeutic exercises, Therapeutic activity, Neuromuscular re-education, Balance training, Gait training, Patient/Family education, Joint manipulation, Joint mobilization, Stair training, Orthotic/Fit training, DME instructions, Aquatic Therapy, Dry Needling, Electrical stimulation, Spinal manipulation, Spinal mobilization, Cryotherapy, Moist heat, Compression bandaging, scar mobilization, Splintting, Taping, Traction, Ultrasound, Ionotophoresis 4mg /ml Dexamethasone, and Manual therapy  PLAN FOR NEXT SESSION: f/u with HEP; possibly DN, manual cervical/thoracic spine/ ribs for mobility/symptoms; postural strength, L shoulder strength, grip strength; spinal mobility, core strength   Wyman Songster, PT 08/04/2023, 2:23 PM

## 2023-08-09 DIAGNOSIS — Z08 Encounter for follow-up examination after completed treatment for malignant neoplasm: Secondary | ICD-10-CM | POA: Diagnosis not present

## 2023-08-09 DIAGNOSIS — I280 Arteriovenous fistula of pulmonary vessels: Secondary | ICD-10-CM | POA: Diagnosis not present

## 2023-08-09 DIAGNOSIS — Z85828 Personal history of other malignant neoplasm of skin: Secondary | ICD-10-CM | POA: Diagnosis not present

## 2023-08-11 ENCOUNTER — Encounter (HOSPITAL_BASED_OUTPATIENT_CLINIC_OR_DEPARTMENT_OTHER): Payer: Self-pay | Admitting: Physical Therapy

## 2023-08-11 ENCOUNTER — Ambulatory Visit (HOSPITAL_BASED_OUTPATIENT_CLINIC_OR_DEPARTMENT_OTHER): Payer: 59 | Admitting: Physical Therapy

## 2023-08-11 DIAGNOSIS — R29898 Other symptoms and signs involving the musculoskeletal system: Secondary | ICD-10-CM

## 2023-08-11 DIAGNOSIS — M542 Cervicalgia: Secondary | ICD-10-CM

## 2023-08-11 NOTE — Therapy (Signed)
OUTPATIENT PHYSICAL THERAPY TREATMENT   Patient Name: BENHAMIN DAMBOISE MRN: [id number] DOB:1961/03/13, 62 y.o., male Today's Date: 08/11/2023 PHYSICAL THERAPY DISCHARGE SUMMARY  Visits from Start of Care: 11  Current functional level related to goals / functional outcomes: See below   Remaining deficits:  See below   Education / Equipment: See below   Patient agrees to discharge. Patient goals were partially met. Patient is being discharged due to  ability to complete HEP but continued symptoms.    END OF SESSION:  PT End of Session - 08/11/23 1517     Visit Number 11    Number of Visits 28    Date for PT Re-Evaluation 08/20/23    Authorization Type Primary: [employer] Secondary: generic Aetna    PT Start Time 1517    PT Stop Time 1545    PT Time Calculation (min) 28 min    Activity Tolerance Patient tolerated treatment well    Behavior During Therapy WFL for tasks assessed/performed             Past Medical History:  Diagnosis Date   Hyperlipidemia    Hypertension    Prostate cancer [facility])    Past Surgical History:  Procedure Laterality Date   BACK SURGERY  2010   COLONOSCOPY N/A 07/23/2016   Procedure: COLONOSCOPY;  Surgeon: Corbin Ade, MD;  [facility]: [facility];  Service: Endoscopy;  Laterality: N/A;  10:00 AM   LYMPHADENECTOMY Bilateral 09/10/2020   Procedure: LYMPHADENECTOMY;  Surgeon: Sebastian Ache, MD;  Location: [facility];  Service: Urology;  Laterality: Bilateral;   PROSTATE BIOPSY     ROBOT ASSISTED LAPAROSCOPIC RADICAL PROSTATECTOMY N/A 09/10/2020   Procedure: XI ROBOTIC ASSISTED LAPAROSCOPIC RADICAL PROSTATECTOMY;  Surgeon: Sebastian Ache, MD;  Location: [facility];  Service: Urology;  Laterality: N/A;  3 HRS   Patient Active Problem List   Diagnosis Date Noted   Acute foot pain, right 02/28/2023   Prostate cancer (HCC) 09/10/2020   Elevated PSA 06/17/2020    PCP: Assunta Found MD  REFERRING PROVIDER: Tressie Stalker,  MD  REFERRING DIAG: M54.2 (ICD-10-CM) - Cervicalgia  THERAPY DIAG:  Cervicalgia  Other symptoms and signs involving the musculoskeletal system  Rationale for Evaluation and Treatment: Rehabilitation  ONSET DATE: Earlier this year  SUBJECTIVE:                                                                                                                                                                                                         SUBJECTIVE STATEMENT: Patient states been working  out. Still trying to increase the weights. Doing HEP and trying to push himself. Patient has not had any change in neck symptoms, has symptoms in ulnar distrubution. Some R hand symptoms. Still dexterity issues. Feeling generally stronger overall.   EVAL: Patient states neck pain and numbness that began earlier this year. Had shooting pain down back of arm from neck. He notes decreased grip strength in L hand and decreased dexterity while tying sutures. Maybe a little in R hand on strength loss. Symptoms follow a C8 dermatome in LUE. Symptoms began with insidious onset. Neck pain intermittent but neuropathy symptoms present in L arm C8 dermatome pretty constant. Started using robots for surgery in November 2023 when necessary/able. No L shoulder injury.   PERTINENT HISTORY:  Hx prostate cancer, HTN, HLD  PAIN:  Are you having pain? Yes: NPRS scale: intermittent 6/10 Pain location: neck and into LUE Pain description: achy Aggravating factors: intermittent Relieving factors: none  PRECAUTIONS: None  WEIGHT BEARING RESTRICTIONS: No  FALLS:  Has patient fallen in last 6 months? No   OCCUPATION: Surgeon  PLOF: Independent  PATIENT GOALS: get PT eval   OBJECTIVE:   DIAGNOSTIC FINDINGS:  MRI 01/27/23 IMPRESSION: 1. Cervical spondylosis as outlined and with findings most notably as follows. 2. At C7-T1, left-sided disc osteophyte ridge contributes to severe left neural foraminal  narrowing. 3. At C6-C7, there is moderate-to-advanced disc degeneration (with mild degenerative endplate edema). Multifactorial mild-to-moderate spinal canal stenosis. Severe bilateral neural foraminal narrowing. 4. At C5-C6, there is moderate-to-advanced disc degeneration (with mild degenerative endplate edema). A posterior disc osteophyte complex effaces the ventral thecal sac, mildly flattening the ventral aspect of the spinal cord. However, the dorsal CSF space is maintained within the spinal canal. Multifactorial bilateral neural foraminal narrowing (severe right, moderate/severe left). 5. At C3-C4, a posterior disc osteophyte complex and superimposed central disc protrusion efface the ventral thecal sac, mildly flattening the ventral aspect of the spinal cord. However, the dorsal CSF space is maintained within the spinal canal. Multifactorial bilateral neural foraminal narrowing also present at this level (severe right, moderate left).  PATIENT SURVEYS: FOTO complete next session- time limited  COGNITION: Overall cognitive status: Within functional limits for tasks assessed  SENSATION:Light touch: decreased L - C8  POSTURE: rounded shoulders, forward head, and increased thoracic kyphosis  PALPATION: TTP grossly cervical paraspinals R>L, hypomobile R and L UPA  throughout c/sp, trigger point L UT, hypomobile 1st rib bilaterally L>R with symptoms into L UE   CERVICAL ROM:   Active ROM A/PROM (deg) eval AROM 07/09/23 08/09/23  Flexion 50 32 40 42  Extension 30 * 29 16 31   Right lateral flexion 12 12 12 18   Left lateral flexion 15 5 10 7   Right rotation 29 27 40 50  Left rotation 34 25 30 38   (Blank rows = not tested) *=symptoms  UPPER EXTREMITY ROM: WFL for tasks assessed    UPPER EXTREMITY MMT:  MMT Right eval Left eval Right 06/15/23 Left 06/15/23 Right 07/09/23 Left 07/09/23 Right 08/11/23 Left 08/11/23  Shoulder flexion 5 4 4+ 4 4 4+ 4 4+  Shoulder  extension          Shoulder abduction 5 4 4+ 4 4+ 4+ 4+ 5  Shoulder adduction          Shoulder extension          Shoulder internal rotation 5 5 5 5 5 5 5 5   Shoulder external rotation 5 4+ 5 4+ 5 5  5 5  Middle trapezius          Lower trapezius          Elbow flexion 5 5 5  4+ 4+ 4+ 5 5  Elbow extension 5 5 5  4+ 5 4+ 5 5  Wrist flexion          Wrist extension          Wrist ulnar deviation          Wrist radial deviation          Wrist pronation          Wrist supination          Grip strength WFL decreased decreased decreased decreased decreased decreased decreased   (Blank rows = not tested)  CERVICAL SPECIAL TESTS:  Spurling's test: Positive and Distraction test: Negative    TODAY'S TREATMENT:                                                                                                                              DATE:  08/11/23 Reassessment   08/04/23 Supine cervical retractation 2x 10   Seated cervical retractions 2 x 10  Seated UT stretches 3 x 20 second holds Ulnar nerve flossing 2 x 10 Seated levator scapulae stretch 3 x 20 second holds Doorway pec stretch 3 x 20 second holds  07/23/23 Cable ER in neutral 5# 3 x 10 bilateral  Cable IR in neutral 5# 3 x 10 bilateral  Cable pec 5# each side 2 x 10  Cable shoulder ER at 90 abduction 5# 2 x 8 bilateral Cable shoulder IR at 90 abduction 5# 2 x 8 bilateral Cable unilateral bent row 10# 3 x 10   07/14/23 Ab set 20 x 5 second holds Supine march with ab set 2 x 10 Bridge  3 x 10  Abdominal isometric green ball 10 x 3- 5 second holds Palof press 10# 2 x 10  Chops/lifts 10# 2 x 10 each SA activation 2 x 10 at counter  07/09/23 UBE level 6, 2 forward 2 retro Reassessment Standing shoulder abduction 5# 2 x 10 Standing shoulder scaption 5# 2 x 10 Prone shoulder extension 3# 2 x 10  Prone shoulder horizontal abduction 3# 2 x 10  Standing hammer curl 8# 2 x 10  Standing pronated curls 5# 2 x  10   06/30/23 UBE level 6, 2 forward 2 retro Pulldown 40# 4 x 10 Seated low row machine 25# 3 x 10 Bicep curl machine 40# 3 x 10  Cable shoulder ER at 90 abduction 5# 1 x 8 bilateral Cable shoulder IR at 90 abduction 5# 1 x 8 bilateral   06/24/23 UBE level 6, 2 forward 2 retro Seated Row 25# 3 x 10  Seated lat pull down 20# 3 x 10 Seated shoulder press 10# 3 x 10  Seated low row machine 25# 3 x 10 Chest press 25# 3 x 10  06/15/23 Reassessment Cable bicep curl 15# 2 x 10  Cable bilateral shoulder extension 15# 2 x 10 Cable elbow extension 10# 2x 10    05/04/23 Manual: supine cervical retraction x 30, ret-ext x 5 Thoracic extension over chair 10 x 5 second holds Scapular retraction with GH ER RTB 2 x 10 Standing horizontal abduction GTB 2x10 Standing shoulder PNF D2 GTB 2 x 10  04/18/23 Seated cervical retractions 2 x 10  Seated scap retractions 2 x 10 Doorway pec stretch 3 x 30 second holds Supine cervical retractation 2x 10   Manual: R and L UPA grade III to mid/lower cervical spine, STM L UT, L first rib inferior glides grade III  Manual: STM to L UT pre and post dry needling for trigger point identification and muscular relaxation.  Trigger Point Dry-Needling  Treatment instructions: Expect mild to moderate muscle soreness. S/S of pneumothorax if dry needled over a lung field, and to seek immediate medical attention should they occur. Patient verbalized understanding of these instructions and education.  Patient Consent Given: Yes Education handout provided: Yes Muscles treated: L UT Electrical stimulation performed: No Parameters: N/A Treatment response/outcome: twitch response elicited  Seated UT stretch 3 x 30 second holds L UT Scapular retraction with GH ER RTB 2 x 10 Standing row RTB 2 x 10   04/12/23 Supine cervical retractation 2x 10   Seated cervical retractions 2 x 10  Seated scap retractions 2 x 10 Doorway pec stretch 3 x 30 second holds  PATIENT  EDUCATION:  Education details: 04/18/23: HEP, DN;  EVAL: Patient educated on exam findings, POC, scope of PT, HEP, and DN, posture. 10/12: reassessment, POC, HEP Person educated: Patient Education method: Explanation, Demonstration, and Handouts Education comprehension: verbalized understanding, returned demonstration, verbal cues required, and tactile cues required  HOME EXERCISE PROGRAM: Access Code: [email or web address] URL: [email or web address]  - Standing Bicep Curls with Resistance  - 1 x daily - 7 x weekly - 3 sets - 10 reps - Triceps Extension   - 1 x daily - 7 x weekly - 3 sets - 10 reps - Shoulder extension with resistance - Neutral  - 1 x daily - 7 x weekly - 3 sets - 10 reps  05/04/23- Standing Shoulder Horizontal Abduction with Resistance  - 1 x daily - 7 x weekly - 2 sets - 10 reps - Standing Shoulder Single Arm PNF D2 Flexion with Resistance (Mirrored)  - 1 x daily - 7 x weekly - 2 sets - 10 reps  04/18/23 - Shoulder External Rotation and Scapular Retraction with Resistance  - 1 x daily - 7 x weekly - 2 sets - 10 reps - Standing Shoulder Row with Anchored Resistance  - 1 x daily - 7 x weekly - 2 sets - 10 reps  Date: 04/12/2023 - Supine Chin Tuck  - 2-3 x daily - 7 x weekly - 2 sets - 10 reps - 2-3 second hold - Seated Cervical Retraction  - 4-5 x daily - 7 x weekly - 2 sets - 10 reps - Seated Scapular Retraction  - 4-5 x daily - 7 x weekly - 2 sets - 10 reps - Doorway Pec Stretch at 60 Elevation  - 3 x daily - 7 x weekly - 3 reps - 30 second hold  9/27 - Seated Row [employer]  - 1 x daily - 7 x weekly - 3 sets - 10 reps - Lat Pull Down  - 1 x daily - 7 x weekly -  3 sets - 10 reps  ASSESSMENT:  CLINICAL IMPRESSION: Patient has met 2/2 short term goals and 0/5 long term goals with ability to complete HEP and improvement in symptoms.  Remaining goals not met due to continued deficits in  strength, ROM, activity tolerance, posture, and functional mobility. Patient has  made good progress toward remaining goals and seems to be making good progress with UE and postural strengthening but remains limited by continued cervical and radicular symptoms. Patient will continue HEP and gym routine. Patient discharged from PT at this time.     OBJECTIVE IMPAIRMENTS: decreased activity tolerance, decreased endurance, decreased mobility, decreased ROM, decreased strength, hypomobility, increased muscle spasms, impaired flexibility, improper body mechanics, postural dysfunction, and pain.   ACTIVITY LIMITATIONS: carrying, lifting, bending, and caring for others  PARTICIPATION LIMITATIONS: meal prep, cleaning, community activity, occupation, and yard work  PERSONAL FACTORS: Profession and Time since onset of injury/illness/exacerbation are also affecting patient's functional outcome.   REHAB POTENTIAL: Good  CLINICAL DECISION MAKING: Stable/uncomplicated  EVALUATION COMPLEXITY: Low   GOALS: Goals reviewed with patient? Yes  SHORT TERM GOALS: Target date: 04/26/2023    Patient will be independent with HEP in order to improve functional outcomes. Baseline: Goal status: MET  2.  Patient will report at least 25% improvement in symptoms for improved quality of life. Baseline: 07/09/23:  maybe 50% in strength Goal status: MET    LONG TERM GOALS: Target date: 05/10/2023    Patient will report at least 75% improvement in symptoms for improved quality of life. Baseline:  Goal status: INITIAL  2.  Patient will improve FOTO score to predicted outcomes in order to indicate improved tolerance to activity. Baseline:  Goal status: INITIAL  3.  Patient will demonstrate at least 25% improvement in cervical ROM in all restricted planes for improved ability to move head while working. Baseline: see above Goal status: INITIAL  4.  Patient will report centralized cervical symptoms no greater than 2/10 for improved ability to use L hand at home and work.  Baseline:  current 4/10 Goal status: INITIAL  5.  Patient will demonstrate grade of 5/5 MMT grade in all tested musculature as evidence of improved strength to assist with lifting and gripping. Baseline: see above Goal status: INITIAL    PLAN:  PT FREQUENCY: 1-2x/week  PT DURATION: 6 weeks  PLANNED INTERVENTIONS: Therapeutic exercises, Therapeutic activity, Neuromuscular re-education, Balance training, Gait training, Patient/Family education, Joint manipulation, Joint mobilization, Stair training, Orthotic/Fit training, DME instructions, Aquatic Therapy, Dry Needling, Electrical stimulation, Spinal manipulation, Spinal mobilization, Cryotherapy, Moist heat, Compression bandaging, scar mobilization, Splintting, Taping, Traction, Ultrasound, Ionotophoresis 4mg /ml Dexamethasone, and Manual therapy  PLAN FOR NEXT SESSION: n/a   Wyman Songster, PT 08/11/2023, 3:46 PM

## 2023-08-12 ENCOUNTER — Other Ambulatory Visit: Payer: Self-pay

## 2023-08-12 ENCOUNTER — Other Ambulatory Visit: Payer: Self-pay | Admitting: Urology

## 2023-08-12 DIAGNOSIS — C61 Malignant neoplasm of prostate: Secondary | ICD-10-CM

## 2023-08-13 LAB — PSA: Prostate Specific Ag, Serum: <0.1 ng/mL (ref 0.0–4.0)

## 2023-08-30 DIAGNOSIS — M4712 Other spondylosis with myelopathy, cervical region: Secondary | ICD-10-CM | POA: Diagnosis not present

## 2023-10-05 ENCOUNTER — Other Ambulatory Visit (HOSPITAL_COMMUNITY): Payer: Self-pay

## 2023-10-05 DIAGNOSIS — Z01818 Encounter for other preprocedural examination: Secondary | ICD-10-CM | POA: Diagnosis not present

## 2023-10-08 ENCOUNTER — Other Ambulatory Visit (HOSPITAL_COMMUNITY): Payer: Self-pay

## 2023-10-23 ENCOUNTER — Other Ambulatory Visit (HOSPITAL_COMMUNITY): Payer: Self-pay

## 2023-10-24 ENCOUNTER — Other Ambulatory Visit (HOSPITAL_COMMUNITY): Payer: Self-pay

## 2023-11-14 ENCOUNTER — Other Ambulatory Visit: Payer: Self-pay

## 2023-11-14 ENCOUNTER — Other Ambulatory Visit (HOSPITAL_COMMUNITY): Payer: Self-pay

## 2023-11-14 DIAGNOSIS — M4712 Other spondylosis with myelopathy, cervical region: Secondary | ICD-10-CM | POA: Diagnosis not present

## 2023-11-14 DIAGNOSIS — M50122 Cervical disc disorder at C5-C6 level with radiculopathy: Secondary | ICD-10-CM | POA: Diagnosis not present

## 2023-11-14 DIAGNOSIS — M4802 Spinal stenosis, cervical region: Secondary | ICD-10-CM | POA: Diagnosis not present

## 2023-11-14 MED ORDER — CYCLOBENZAPRINE HCL 10 MG PO TABS
10.0000 mg | ORAL_TABLET | Freq: Three times a day (TID) | ORAL | 0 refills | Status: DC | PRN
  Filled 2023-11-14 – 2023-11-15 (×2): qty 30, 10d supply, fill #0

## 2023-11-14 MED ORDER — OXYCODONE-ACETAMINOPHEN 5-325 MG PO TABS
1.0000 | ORAL_TABLET | ORAL | 0 refills | Status: DC | PRN
  Filled 2023-11-14 – 2023-11-15 (×2): qty 30, 3d supply, fill #0

## 2023-11-15 ENCOUNTER — Other Ambulatory Visit: Payer: Self-pay

## 2023-11-15 ENCOUNTER — Other Ambulatory Visit (HOSPITAL_COMMUNITY): Payer: Self-pay

## 2023-11-15 MED ORDER — GABAPENTIN 300 MG PO CAPS
300.0000 mg | ORAL_CAPSULE | Freq: Three times a day (TID) | ORAL | 0 refills | Status: DC
  Filled 2023-11-15 (×2): qty 90, 30d supply, fill #0

## 2023-12-06 DIAGNOSIS — M4712 Other spondylosis with myelopathy, cervical region: Secondary | ICD-10-CM | POA: Diagnosis not present

## 2023-12-06 DIAGNOSIS — Z6826 Body mass index (BMI) 26.0-26.9, adult: Secondary | ICD-10-CM | POA: Diagnosis not present

## 2023-12-27 DIAGNOSIS — H524 Presbyopia: Secondary | ICD-10-CM | POA: Diagnosis not present

## 2024-01-01 ENCOUNTER — Other Ambulatory Visit (HOSPITAL_COMMUNITY): Payer: Self-pay

## 2024-01-03 DIAGNOSIS — Z08 Encounter for follow-up examination after completed treatment for malignant neoplasm: Secondary | ICD-10-CM | POA: Diagnosis not present

## 2024-01-03 DIAGNOSIS — Z85828 Personal history of other malignant neoplasm of skin: Secondary | ICD-10-CM | POA: Diagnosis not present

## 2024-01-03 DIAGNOSIS — X32XXXD Exposure to sunlight, subsequent encounter: Secondary | ICD-10-CM | POA: Diagnosis not present

## 2024-01-03 DIAGNOSIS — L570 Actinic keratosis: Secondary | ICD-10-CM | POA: Diagnosis not present

## 2024-01-03 DIAGNOSIS — C44519 Basal cell carcinoma of skin of other part of trunk: Secondary | ICD-10-CM | POA: Diagnosis not present

## 2024-01-09 ENCOUNTER — Other Ambulatory Visit (HOSPITAL_COMMUNITY): Payer: Self-pay

## 2024-01-23 ENCOUNTER — Other Ambulatory Visit (HOSPITAL_COMMUNITY): Payer: Self-pay

## 2024-02-21 DIAGNOSIS — Z08 Encounter for follow-up examination after completed treatment for malignant neoplasm: Secondary | ICD-10-CM | POA: Diagnosis not present

## 2024-02-21 DIAGNOSIS — Z85828 Personal history of other malignant neoplasm of skin: Secondary | ICD-10-CM | POA: Diagnosis not present

## 2024-02-28 DIAGNOSIS — M4712 Other spondylosis with myelopathy, cervical region: Secondary | ICD-10-CM | POA: Diagnosis not present

## 2024-03-26 ENCOUNTER — Other Ambulatory Visit: Payer: Self-pay

## 2024-04-19 ENCOUNTER — Other Ambulatory Visit (HOSPITAL_COMMUNITY): Payer: Self-pay

## 2024-04-24 ENCOUNTER — Other Ambulatory Visit (HOSPITAL_COMMUNITY): Payer: Self-pay

## 2024-04-24 MED ORDER — OLMESARTAN MEDOXOMIL 20 MG PO TABS
20.0000 mg | ORAL_TABLET | Freq: Every day | ORAL | 3 refills | Status: AC
  Filled 2024-04-24 – 2024-06-24 (×2): qty 90, 90d supply, fill #0
  Filled 2024-09-23: qty 90, 90d supply, fill #1

## 2024-04-24 MED ORDER — ROSUVASTATIN CALCIUM 10 MG PO TABS
10.0000 mg | ORAL_TABLET | Freq: Every day | ORAL | 3 refills | Status: AC
  Filled 2024-04-24 – 2024-07-22 (×2): qty 90, 90d supply, fill #0
  Filled 2024-10-17: qty 90, 90d supply, fill #1

## 2024-05-03 ENCOUNTER — Ambulatory Visit (HOSPITAL_BASED_OUTPATIENT_CLINIC_OR_DEPARTMENT_OTHER): Attending: Neurosurgery | Admitting: Physical Therapy

## 2024-05-03 ENCOUNTER — Encounter (HOSPITAL_BASED_OUTPATIENT_CLINIC_OR_DEPARTMENT_OTHER): Payer: Self-pay | Admitting: Physical Therapy

## 2024-05-03 DIAGNOSIS — R29898 Other symptoms and signs involving the musculoskeletal system: Secondary | ICD-10-CM | POA: Diagnosis not present

## 2024-05-03 DIAGNOSIS — M542 Cervicalgia: Secondary | ICD-10-CM | POA: Insufficient documentation

## 2024-05-03 NOTE — Therapy (Signed)
 OUTPATIENT PHYSICAL THERAPY CERVICAL EVALUATION   Patient Name: [name] MRN: [id number] DOB:1961-02-28, 63 y.o., male Today's Date: 05/03/2024  END OF SESSION:  PT End of Session - 05/03/24 1457     Visit Number 1    Number of Visits 12    Date for PT Re-Evaluation 07/26/24    Authorization Type [facility] [employer]    PT Start Time 1457    PT Stop Time 1545    PT Time Calculation (min) 48 min    Activity Tolerance Patient tolerated treatment well    Behavior During Therapy [facility] for tasks assessed/performed          Past Medical History:  Diagnosis Date   Hyperlipidemia    Hypertension    Prostate cancer [facility])    Past Surgical History:  Procedure Laterality Date   BACK SURGERY  2010   COLONOSCOPY N/A 07/23/2016   Procedure: COLONOSCOPY;  Surgeon: Lamar CHRISTELLA Hollingshead, MD;  [facility];  Service: Endoscopy;  Laterality: N/A;  10:00 AM   LYMPHADENECTOMY Bilateral 09/10/2020   Procedure: LYMPHADENECTOMY;  Surgeon: Alvaro Hummer, MD;  Location: [facility];  Service: Urology;  Laterality: Bilateral;   PROSTATE BIOPSY     ROBOT ASSISTED LAPAROSCOPIC RADICAL PROSTATECTOMY N/A 09/10/2020   Procedure: XI ROBOTIC ASSISTED LAPAROSCOPIC RADICAL PROSTATECTOMY;  Surgeon: Alvaro Hummer, MD;  Location: [facility];  Service: Urology;  Laterality: N/A;  3 HRS   Patient Active Problem List   Diagnosis Date Noted   Acute foot pain, right 02/28/2023   Prostate cancer (HCC) 09/10/2020   Elevated PSA 06/17/2020    PCP: Norleen General MD   REFERRING PROVIDER: Budge Purchase, MD   REFERRING DIAG: cervical sponlylosis  THERAPY DIAG:  Cervicalgia  Other symptoms and signs involving the musculoskeletal system  Rationale for Evaluation and Treatment: Rehabilitation  ONSET DATE: DOS 10/2023  SUBJECTIVE:                                                                                                                                                                                                          SUBJECTIVE STATEMENT: Patient seen in PT 03/2023- 07/2023 and is status post C5-6, C6-7 and C7-T1 anterior cervical diskectomy fusion and plating in Feb 2025.  Patient states some tingling/numbness in R 2nd digit and snuff box region. Some cervical stiffness. Still working out.   PERTINENT HISTORY:  Hx prostate cancer, HTN, HLD, c/sp fusion  PAIN:  Are you having pain? Yes: NPRS scale: 2/10 Pain location: c/sp Pain description: chronic ache Aggravating factors: increased movement into  end range positions Relieving factors: rest  PRECAUTIONS: None  WEIGHT BEARING RESTRICTIONS: No  FALLS:  Has patient fallen in last 6 months? No   OCCUPATION: Surgeon   PLOF: Independent  PATIENT GOALS: increased mobility   OBJECTIVE: (objective measures from initial evaluation unless otherwise dated)  PATIENT SURVEYS:  NDI:  NECK DISABILITY INDEX  Date: 05/03/24 Score  Pain intensity 2 = The pain is moderate at the moment  2. Personal care (washing, dressing, etc.) 0 = I can look after myself normally without causing extra pain  3. Lifting 0 =  I can lift heavy weights without extra pain  4. Reading 0 = I can read as much as I want to with no pain in my neck  5. Headaches 0 = I have no headaches at all  6. Concentration 0 =  I can concentrate fully when I want to with no difficulty  7. Work 0 =  I can do as much work as I want to  8. Driving 0 = I can drive my car without any neck pain  9. Sleeping 1 = My sleep is slightly disturbed (less than 1 hr sleepless)  10. Recreation 0 = I am able to engage in all my recreation activities with no neck pain at all  Total 3/50   Minimum Detectable Change (90% confidence): 5 points or 10% points  COGNITION: Overall cognitive status: Within functional limits for tasks assessed  SENSATION: Impaired R 2nd digit  POSTURE: rounded shoulders and forward head  PALPATION: Hypomobile grossly in c/sp, decrease symptoms with gentle  traction/SOR  CERVICAL ROM:   Active ROM A/PROM (deg) eval  Flexion 30  Extension 15  Right lateral flexion 12  Left lateral flexion 10  Right rotation 40  Left rotation 30   (Blank rows = not tested) *=pain/symptoms  UPPER EXTREMITY ROM: WFL for tasks assessed  Active ROM Right eval Left eval  Shoulder flexion    Shoulder extension    Shoulder abduction    Shoulder adduction    Shoulder extension    Shoulder internal rotation    Shoulder external rotation    Elbow flexion    Elbow extension    Wrist flexion    Wrist extension    Wrist ulnar deviation    Wrist radial deviation    Wrist pronation    Wrist supination     (Blank rows = not tested) *=pain/symptoms  UPPER EXTREMITY MMT:   MMT Right eval Left eval  Shoulder flexion 5 5  Shoulder extension    Shoulder abduction 5 5  Shoulder adduction    Shoulder extension    Shoulder internal rotation    Shoulder external rotation    Middle trapezius    Lower trapezius    Elbow flexion 5 5  Elbow extension 5 5  Wrist flexion 5 5  Wrist extension 5 5  Wrist ulnar deviation    Wrist radial deviation    Wrist pronation    Wrist supination    Grip strength WFL WFL   (Blank rows = not tested) *=pain/symptoms    TODAY'S TREATMENT:  DATE:  05/03/24  Manual: STM to cervical paraspinals, grade II-III cervical R and L UPA C2-C4, gentle traction/SOR Seated cervical retractions 2 x 10  PATIENT EDUCATION:  Education details: Patient educated on exam findings, POC, scope of PT, HEP. Person educated: Patient Education method: Explanation, Demonstration, and Handouts Education comprehension: verbalized understanding, returned demonstration, verbal cues required, and tactile cues required  HOME EXERCISE PROGRAM: Access Code: [email or web address] URL: [email or web address] Date:  05/03/2024 Prepared by: Prentice Williom Cedar  Exercises - Seated Cervical Retraction  - 3 x daily - 7 x weekly - 2 sets - 10 reps  ASSESSMENT:  CLINICAL IMPRESSION: Patient a 63 y.o. y.o. male who was seen today for physical therapy evaluation and treatment for cervicalgia. Patient presents with deficits in cervical spine strength, ROM, endurance, activity tolerance, posture, and functional mobility with ADL. Patient is having to modify and restrict ADL as indicated by outcome measure score as well as subjective information and objective measures which is affecting overall participation. Patient will benefit from skilled physical therapy in order to improve function and reduce impairment.  OBJECTIVE IMPAIRMENTS:  decreased activity tolerance, decreased endurance, decreased mobility, decreased ROM, decreased strength, increased muscle spasms, impaired flexibility, impaired UE functional use, improper body mechanics, postural dysfunction, and pain  ACTIVITY LIMITATIONS: carrying, lifting, bending, reach over head, hygiene/grooming, and caring for others  PARTICIPATION LIMITATIONS:  meal prep, cleaning, laundry, driving, shopping, community activity, occupation, and yard work  PERSONAL FACTORS: Profession and Time since onset of injury/illness/exacerbation are also affecting patient's functional outcome.   REHAB POTENTIAL: Good  CLINICAL DECISION MAKING: Stable/uncomplicated  EVALUATION COMPLEXITY: Low   GOALS: Goals reviewed with patient? Yes  SHORT TERM GOALS: Target date: 06/14/2024    Patient will be independent with HEP in order to improve functional outcomes. Baseline: Goal status: INITIAL  2.  Patient will report at least 25% improvement in symptoms for improved quality of life. Baseline:  Goal status: INITIAL    LONG TERM GOALS: Target date: 07/26/2024    Patient will report at least 75% improvement in symptoms for improved quality of life. Baseline:  Goal status:  INITIAL  2.  Patient will improve NDI score by at least 3 points in order to indicate improved tolerance to activity. Baseline:  Goal status: INITIAL  3.  Patient will demonstrate at least 10 degree improvement in cervical ROM in all restricted planes for improved ability to move head while working. Baseline:  Goal status: INITIAL  4.  Patient will be able to return to all activities unrestricted for improved ability to perform work functions and participate with family.  Baseline:  Goal status: INITIAL    PLAN:  PT FREQUENCY: 1x/week  PT DURATION: 12 weeks  PLANNED INTERVENTIONS: [id number]- PT Re-evaluation, 97110-Therapeutic exercises, 97530- Therapeutic activity, [id number]- Neuromuscular re-education, 97535- Self Care, [id number]- Manual therapy, [id number]- Gait training, [phone]- Orthotic Fit/training, [phone]- Canalith repositioning, [id number]- Aquatic Therapy, [phone]- Splinting, [phone]- Wound care (first 20 sq cm), 97598- Wound care (each additional 20 sq cm)Patient/Family education, Balance training, Stair training, Taping, Dry Needling, Joint mobilization, Joint manipulation, Spinal manipulation, Spinal mobilization, Scar mobilization, and DME instructions.  PLAN FOR NEXT SESSION: cervical and thoracic mobility, postural strength   Prentice GORMAN Stains, PT, DPT 05/03/2024, 4:03 PM

## 2024-05-09 ENCOUNTER — Encounter (HOSPITAL_BASED_OUTPATIENT_CLINIC_OR_DEPARTMENT_OTHER): Payer: Self-pay | Admitting: Physical Therapy

## 2024-05-09 ENCOUNTER — Ambulatory Visit (HOSPITAL_BASED_OUTPATIENT_CLINIC_OR_DEPARTMENT_OTHER): Admitting: Physical Therapy

## 2024-05-09 DIAGNOSIS — R29898 Other symptoms and signs involving the musculoskeletal system: Secondary | ICD-10-CM

## 2024-05-09 DIAGNOSIS — M542 Cervicalgia: Secondary | ICD-10-CM | POA: Diagnosis not present

## 2024-05-09 NOTE — Therapy (Signed)
 OUTPATIENT PHYSICAL THERAPY TREATMENT   Patient Name: JONATHAN CORPUS MRN: [id number] DOB:Feb 18, 1961, 63 y.o., male Today's Date: 05/09/2024  END OF SESSION:  PT End of Session - 05/09/24 1113     Visit Number 2    Number of Visits 12    Date for PT Re-Evaluation 07/26/24    Authorization Type [facility] [employer]    PT Start Time 1113    PT Stop Time 1205    PT Time Calculation (min) 52 min    Activity Tolerance Patient tolerated treatment well    Behavior During Therapy [facility] for tasks assessed/performed          Past Medical History:  Diagnosis Date   Hyperlipidemia    Hypertension    Prostate cancer [facility])    Past Surgical History:  Procedure Laterality Date   BACK SURGERY  2010   COLONOSCOPY N/A 07/23/2016   Procedure: COLONOSCOPY;  Surgeon: Lamar CHRISTELLA Hollingshead, MD;  [facility];  Service: Endoscopy;  Laterality: N/A;  10:00 AM   LYMPHADENECTOMY Bilateral 09/10/2020   Procedure: LYMPHADENECTOMY;  Surgeon: Alvaro Hummer, MD;  Location: [facility];  Service: Urology;  Laterality: Bilateral;   PROSTATE BIOPSY     ROBOT ASSISTED LAPAROSCOPIC RADICAL PROSTATECTOMY N/A 09/10/2020   Procedure: XI ROBOTIC ASSISTED LAPAROSCOPIC RADICAL PROSTATECTOMY;  Surgeon: Alvaro Hummer, MD;  Location: [facility];  Service: Urology;  Laterality: N/A;  3 HRS   Patient Active Problem List   Diagnosis Date Noted   Acute foot pain, right 02/28/2023   Prostate cancer (HCC) 09/10/2020   Elevated PSA 06/17/2020    PCP: Norleen General MD   REFERRING PROVIDER: Budge Purchase, MD   REFERRING DIAG: cervical sponlylosis  THERAPY DIAG:  Cervicalgia  Other symptoms and signs involving the musculoskeletal system  Rationale for Evaluation and Treatment: Rehabilitation  ONSET DATE: DOS 10/2023  SUBJECTIVE:                                                                                                                                                                                                          SUBJECTIVE STATEMENT: Patient states mild soreness following manual.   EVAL: Patient seen in PT 03/2023- 07/2023 and is status post C5-6, C6-7 and C7-T1 anterior cervical diskectomy fusion and plating in Feb 2025.  Patient states some tingling/numbness in R 2nd digit and snuff box region. Some cervical stiffness. Still working out.   PERTINENT HISTORY:  Hx prostate cancer, HTN, HLD, c/sp fusion  PAIN:  Are you having pain? Yes: NPRS scale: 2/10 Pain location: c/sp Pain  description: chronic ache Aggravating factors: increased movement into end range positions Relieving factors: rest  PRECAUTIONS: None  WEIGHT BEARING RESTRICTIONS: No  FALLS:  Has patient fallen in last 6 months? No   OCCUPATION: Surgeon   PLOF: Independent  PATIENT GOALS: increased mobility   OBJECTIVE: (objective measures from initial evaluation unless otherwise dated)  PATIENT SURVEYS:  NDI:  NECK DISABILITY INDEX  Date: 05/03/24 Score  Pain intensity 2 = The pain is moderate at the moment  2. Personal care (washing, dressing, etc.) 0 = I can look after myself normally without causing extra pain  3. Lifting 0 =  I can lift heavy weights without extra pain  4. Reading 0 = I can read as much as I want to with no pain in my neck  5. Headaches 0 = I have no headaches at all  6. Concentration 0 =  I can concentrate fully when I want to with no difficulty  7. Work 0 =  I can do as much work as I want to  8. Driving 0 = I can drive my car without any neck pain  9. Sleeping 1 = My sleep is slightly disturbed (less than 1 hr sleepless)  10. Recreation 0 = I am able to engage in all my recreation activities with no neck pain at all  Total 3/50   Minimum Detectable Change (90% confidence): 5 points or 10% points  COGNITION: Overall cognitive status: Within functional limits for tasks assessed  SENSATION: Impaired R 2nd digit  POSTURE: rounded shoulders and forward head  PALPATION: Hypomobile grossly  in c/sp, decrease symptoms with gentle traction/SOR  CERVICAL ROM:   Active ROM A/PROM (deg) eval AROM 05/09/24  Flexion 30 30  Extension 15 20  Right lateral flexion 12 15  Left lateral flexion 10 8  Right rotation 40 46  Left rotation 30 47   (Blank rows = not tested) *=pain/symptoms  UPPER EXTREMITY ROM: WFL for tasks assessed  Active ROM Right eval Left eval  Shoulder flexion    Shoulder extension    Shoulder abduction    Shoulder adduction    Shoulder extension    Shoulder internal rotation    Shoulder external rotation    Elbow flexion    Elbow extension    Wrist flexion    Wrist extension    Wrist ulnar deviation    Wrist radial deviation    Wrist pronation    Wrist supination     (Blank rows = not tested) *=pain/symptoms  UPPER EXTREMITY MMT:   MMT Right eval Left eval  Shoulder flexion 5 5  Shoulder extension    Shoulder abduction 5 5  Shoulder adduction    Shoulder extension    Shoulder internal rotation    Shoulder external rotation    Middle trapezius    Lower trapezius    Elbow flexion 5 5  Elbow extension 5 5  Wrist flexion 5 5  Wrist extension 5 5  Wrist ulnar deviation    Wrist radial deviation    Wrist pronation    Wrist supination    Grip strength WFL WFL   (Blank rows = not tested) *=pain/symptoms    TODAY'S TREATMENT:  DATE:  05/09/24 Manual: STM to cervical paraspinals, grade II-III cervical R and L UPA C2-C4, gentle traction/SOR Seated cervical retractions 2 x 10 Seated gentle SNAGS - extension, rotation 2 x 10 each Seated UT stretch 3 x 20 second holds Seated thoracic extension over chair 10 x 5 second holds  05/03/24  Manual: STM to cervical paraspinals, grade II-III cervical R and L UPA C2-C4, gentle traction/SOR Seated cervical retractions 2 x 10  PATIENT EDUCATION:  Education details: Patient  educated on exam findings, POC, scope of PT, HEP. 05/09/24: HEP Person educated: Patient Education method: Programmer, multimedia, Demonstration, and Handouts Education comprehension: verbalized understanding, returned demonstration, verbal cues required, and tactile cues required  HOME EXERCISE PROGRAM: Access Code: [email or web address] URL: [email or web address] Date: 05/03/2024 Prepared by: Prentice Shakiyah Cirilo  Exercises - Seated Cervical Retraction  - 3 x daily - 7 x weekly - 2 sets - 10 reps  ASSESSMENT:  CLINICAL IMPRESSION: Continued hypomobile c/sp with decrease in tissue tension with manual and improvement in ROM following. Began additional cervical/thoracic mobility exercises today. Patient will continue to benefit from physical therapy in order to improve function and reduce impairment.   OBJECTIVE IMPAIRMENTS:  decreased activity tolerance, decreased endurance, decreased mobility, decreased ROM, decreased strength, increased muscle spasms, impaired flexibility, impaired UE functional use, improper body mechanics, postural dysfunction, and pain  ACTIVITY LIMITATIONS: carrying, lifting, bending, reach over head, hygiene/grooming, and caring for others  PARTICIPATION LIMITATIONS:  meal prep, cleaning, laundry, driving, shopping, community activity, occupation, and yard work  PERSONAL FACTORS: Profession and Time since onset of injury/illness/exacerbation are also affecting patient's functional outcome.   REHAB POTENTIAL: Good  CLINICAL DECISION MAKING: Stable/uncomplicated  EVALUATION COMPLEXITY: Low   GOALS: Goals reviewed with patient? Yes  SHORT TERM GOALS: Target date: 06/14/2024    Patient will be independent with HEP in order to improve functional outcomes. Baseline: Goal status: INITIAL  2.  Patient will report at least 25% improvement in symptoms for improved quality of life. Baseline:  Goal status: INITIAL    LONG TERM GOALS: Target date: 07/26/2024     Patient will report at least 75% improvement in symptoms for improved quality of life. Baseline:  Goal status: INITIAL  2.  Patient will improve NDI score by at least 3 points in order to indicate improved tolerance to activity. Baseline:  Goal status: INITIAL  3.  Patient will demonstrate at least 10 degree improvement in cervical ROM in all restricted planes for improved ability to move head while working. Baseline:  Goal status: INITIAL  4.  Patient will be able to return to all activities unrestricted for improved ability to perform work functions and participate with family.  Baseline:  Goal status: INITIAL    PLAN:  PT FREQUENCY: 1x/week  PT DURATION: 12 weeks  PLANNED INTERVENTIONS: 97164- PT Re-evaluation, 97110-Therapeutic exercises, 97530- Therapeutic activity, [id number]- Neuromuscular re-education, 97535- Self Care, [id number]- Manual therapy, [id number]- Gait training, [phone]- Orthotic Fit/training, [phone]- Canalith repositioning, [id number]- Aquatic Therapy, [phone]- Splinting, [phone]- Wound care (first 20 sq cm), 97598- Wound care (each additional 20 sq cm)Patient/Family education, Balance training, Stair training, Taping, Dry Needling, Joint mobilization, Joint manipulation, Spinal manipulation, Spinal mobilization, Scar mobilization, and DME instructions.  PLAN FOR NEXT SESSION: cervical and thoracic mobility, postural strength   Prentice GORMAN Stains, PT, DPT 05/09/2024, 12:09 PM

## 2024-06-05 ENCOUNTER — Other Ambulatory Visit: Payer: Self-pay

## 2024-06-05 ENCOUNTER — Other Ambulatory Visit (HOSPITAL_COMMUNITY): Payer: Self-pay

## 2024-06-05 DIAGNOSIS — Z08 Encounter for follow-up examination after completed treatment for malignant neoplasm: Secondary | ICD-10-CM | POA: Diagnosis not present

## 2024-06-05 DIAGNOSIS — Z1283 Encounter for screening for malignant neoplasm of skin: Secondary | ICD-10-CM | POA: Diagnosis not present

## 2024-06-05 DIAGNOSIS — L280 Lichen simplex chronicus: Secondary | ICD-10-CM | POA: Diagnosis not present

## 2024-06-05 DIAGNOSIS — L570 Actinic keratosis: Secondary | ICD-10-CM | POA: Diagnosis not present

## 2024-06-05 DIAGNOSIS — X32XXXD Exposure to sunlight, subsequent encounter: Secondary | ICD-10-CM | POA: Diagnosis not present

## 2024-06-05 DIAGNOSIS — Z85828 Personal history of other malignant neoplasm of skin: Secondary | ICD-10-CM | POA: Diagnosis not present

## 2024-06-05 MED ORDER — HALOBETASOL PROPIONATE 0.05 % EX CREA
TOPICAL_CREAM | CUTANEOUS | 3 refills | Status: AC
  Filled 2024-06-05: qty 50, 25d supply, fill #0

## 2024-06-07 ENCOUNTER — Encounter (HOSPITAL_BASED_OUTPATIENT_CLINIC_OR_DEPARTMENT_OTHER): Admitting: Physical Therapy

## 2024-06-14 ENCOUNTER — Ambulatory Visit (HOSPITAL_BASED_OUTPATIENT_CLINIC_OR_DEPARTMENT_OTHER): Attending: Neurosurgery | Admitting: Physical Therapy

## 2024-06-14 ENCOUNTER — Encounter (HOSPITAL_BASED_OUTPATIENT_CLINIC_OR_DEPARTMENT_OTHER): Payer: Self-pay | Admitting: Physical Therapy

## 2024-06-14 DIAGNOSIS — M542 Cervicalgia: Secondary | ICD-10-CM | POA: Diagnosis not present

## 2024-06-14 DIAGNOSIS — R29898 Other symptoms and signs involving the musculoskeletal system: Secondary | ICD-10-CM | POA: Insufficient documentation

## 2024-06-14 NOTE — Therapy (Signed)
 OUTPATIENT PHYSICAL THERAPY TREATMENT   Patient Name: [name] MRN: [id number] DOB:09-Aug-1961, 63 y.o., male Today's Date: 06/14/2024  PHYSICAL THERAPY DISCHARGE SUMMARY  Visits from Start of Care: 3  Current functional level related to goals / functional outcomes: See below   Remaining deficits: See below   Education / Equipment: See below   Patient agrees to discharge. Patient goals were partially met. Patient is being discharged due to being pleased with the current functional level.   END OF SESSION:  PT End of Session - 06/14/24 1347     Visit Number 3    Number of Visits 12    Date for Recertification  07/26/24    Authorization Type [facility] [employer]    PT Start Time 1347    PT Stop Time 1413    PT Time Calculation (min) 26 min    Activity Tolerance Patient tolerated treatment well    Behavior During Therapy [facility] for tasks assessed/performed          Past Medical History:  Diagnosis Date   Hyperlipidemia    Hypertension    Prostate cancer [facility])    Past Surgical History:  Procedure Laterality Date   BACK SURGERY  2010   COLONOSCOPY N/A 07/23/2016   Procedure: COLONOSCOPY;  Surgeon: Lamar CHRISTELLA Hollingshead, MD;  [facility];  Service: Endoscopy;  Laterality: N/A;  10:00 AM   LYMPHADENECTOMY Bilateral 09/10/2020   Procedure: LYMPHADENECTOMY;  Surgeon: Alvaro Hummer, MD;  Location: [facility];  Service: Urology;  Laterality: Bilateral;   PROSTATE BIOPSY     ROBOT ASSISTED LAPAROSCOPIC RADICAL PROSTATECTOMY N/A 09/10/2020   Procedure: XI ROBOTIC ASSISTED LAPAROSCOPIC RADICAL PROSTATECTOMY;  Surgeon: Alvaro Hummer, MD;  Location: [facility];  Service: Urology;  Laterality: N/A;  3 HRS   Patient Active Problem List   Diagnosis Date Noted   Acute foot pain, right 02/28/2023   Prostate cancer (HCC) 09/10/2020   Elevated PSA 06/17/2020    PCP: Norleen General MD   REFERRING PROVIDER: Budge Purchase, MD   REFERRING DIAG: cervical sponlylosis  THERAPY DIAG:   Cervicalgia  Other symptoms and signs involving the musculoskeletal system  Rationale for Evaluation and Treatment: Rehabilitation  ONSET DATE: DOS 10/2023  SUBJECTIVE:                                                                                                                                                                                                         SUBJECTIVE STATEMENT: Patient states Did HEP. Patient states 80-90% functional status.   EVAL: Patient seen in PT  03/2023- 07/2023 and is status post C5-6, C6-7 and C7-T1 anterior cervical diskectomy fusion and plating in Feb 2025.  Patient states some tingling/numbness in R 2nd digit and snuff box region. Some cervical stiffness. Still working out.   PERTINENT HISTORY:  Hx prostate cancer, HTN, HLD, c/sp fusion  PAIN:  Are you having pain? Yes: NPRS scale: 2/10 Pain location: c/sp Pain description: chronic ache Aggravating factors: increased movement into end range positions Relieving factors: rest  PRECAUTIONS: None  WEIGHT BEARING RESTRICTIONS: No  FALLS:  Has patient fallen in last 6 months? No   OCCUPATION: Surgeon   PLOF: Independent  PATIENT GOALS: increased mobility   OBJECTIVE: (objective measures from initial evaluation unless otherwise dated)  PATIENT SURVEYS:  NDI:  NECK DISABILITY INDEX  Date: 05/03/24 Score  Pain intensity 2 = The pain is moderate at the moment  2. Personal care (washing, dressing, etc.) 0 = I can look after myself normally without causing extra pain  3. Lifting 0 =  I can lift heavy weights without extra pain  4. Reading 0 = I can read as much as I want to with no pain in my neck  5. Headaches 0 = I have no headaches at all  6. Concentration 0 =  I can concentrate fully when I want to with no difficulty  7. Work 0 =  I can do as much work as I want to  8. Driving 0 = I can drive my car without any neck pain  9. Sleeping 1 = My sleep is slightly disturbed (less than 1 hr  sleepless)  10. Recreation 0 = I am able to engage in all my recreation activities with no neck pain at all  Total 3/50   Minimum Detectable Change (90% confidence): 5 points or 10% points  COGNITION: Overall cognitive status: Within functional limits for tasks assessed  SENSATION: Impaired R 2nd digit  POSTURE: rounded shoulders and forward head  PALPATION: Hypomobile grossly in c/sp, decrease symptoms with gentle traction/SOR  CERVICAL ROM:   Active ROM A/PROM (deg) eval AROM 05/09/24 AROM 06/14/24  Flexion 30 30 29   Extension 15 20 18   Right lateral flexion 12 15 15   Left lateral flexion 10 8 16   Right rotation 40 46 29  Left rotation 30 47 30   (Blank rows = not tested) *=pain/symptoms  UPPER EXTREMITY ROM: WFL for tasks assessed  Active ROM Right eval Left eval  Shoulder flexion    Shoulder extension    Shoulder abduction    Shoulder adduction    Shoulder extension    Shoulder internal rotation    Shoulder external rotation    Elbow flexion    Elbow extension    Wrist flexion    Wrist extension    Wrist ulnar deviation    Wrist radial deviation    Wrist pronation    Wrist supination     (Blank rows = not tested) *=pain/symptoms  UPPER EXTREMITY MMT:   MMT Right eval Left eval Right 9/18 Left 9/18  Shoulder flexion 5 5 5 5   Shoulder extension      Shoulder abduction 5 5 5 5   Shoulder adduction      Shoulder extension      Shoulder internal rotation      Shoulder external rotation      Middle trapezius      Lower trapezius      Elbow flexion 5 5 5 5   Elbow extension 5 5 5  5  Wrist flexion 5 5 5 5   Wrist extension 5 5 5 5   Wrist ulnar deviation      Wrist radial deviation      Wrist pronation      Wrist supination      Grip strength WFL [facility] WFL WFL   (Blank rows = not tested) *=pain/symptoms    TODAY'S TREATMENT:                                                                                                                               DATE:  06/14/24 Reassessment    05/09/24 Manual: STM to cervical paraspinals, grade II-III cervical R and L UPA C2-C4, gentle traction/SOR Seated cervical retractions 2 x 10 Seated gentle SNAGS - extension, rotation 2 x 10 each Seated UT stretch 3 x 20 second holds Seated thoracic extension over chair 10 x 5 second holds  05/03/24  Manual: STM to cervical paraspinals, grade II-III cervical R and L UPA C2-C4, gentle traction/SOR Seated cervical retractions 2 x 10  PATIENT EDUCATION:  Education details: Patient educated on exam findings, POC, scope of PT, HEP. 05/09/24: HEP Person educated: Patient Education method: Programmer, multimedia, Demonstration, and Handouts Education comprehension: verbalized understanding, returned demonstration, verbal cues required, and tactile cues required  HOME EXERCISE PROGRAM: Access Code: [email or web address] URL: [email or web address] Date: 05/03/2024 Prepared by: Prentice Elayne Gruver  Exercises - Seated Cervical Retraction  - 3 x daily - 7 x weekly - 2 sets - 10 reps  ASSESSMENT:  CLINICAL IMPRESSION: Patient has met 2/2 short term goals and 2/4 long term goals with ability to complete HEP and improvement in symptoms, strength, activity tolerance, and functional mobility. Remaining goals not met due to continued deficits in ROM. Patient has made good progress toward remaining goals. Patient has returned to activity with minimal/ no limitation. Patient discharged from PT.      OBJECTIVE IMPAIRMENTS:  decreased activity tolerance, decreased endurance, decreased mobility, decreased ROM, decreased strength, increased muscle spasms, impaired flexibility, impaired UE functional use, improper body mechanics, postural dysfunction, and pain  ACTIVITY LIMITATIONS: carrying, lifting, bending, reach over head, hygiene/grooming, and caring for others  PARTICIPATION LIMITATIONS:  meal prep, cleaning, laundry, driving, shopping, community activity, occupation, and yard  work  PERSONAL FACTORS: Profession and Time since onset of injury/illness/exacerbation are also affecting patient's functional outcome.   REHAB POTENTIAL: Good  CLINICAL DECISION MAKING: Stable/uncomplicated  EVALUATION COMPLEXITY: Low   GOALS: Goals reviewed with patient? Yes  SHORT TERM GOALS: Target date: 06/14/2024    Patient will be independent with HEP in order to improve functional outcomes. Baseline: Goal status: MET  2.  Patient will report at least 25% improvement in symptoms for improved quality of life. Baseline:  Goal status: MET    LONG TERM GOALS: Target date: 07/26/2024    Patient will report at least 75% improvement in symptoms for improved quality of life. Baseline:  Goal status: MET  2.  Patient will improve NDI score  by at least 3 points in order to indicate improved tolerance to activity. Baseline:  Goal status: INITIAL - deferred   3.  Patient will demonstrate at least 10 degree improvement in cervical ROM in all restricted planes for improved ability to move head while working. Baseline:  Goal status: INITIAL  4.  Patient will be able to return to all activities unrestricted for improved ability to perform work functions and participate with family.  Baseline:  Goal status: MET    PLAN:  PT FREQUENCY: 1x/week  PT DURATION: 12 weeks  PLANNED INTERVENTIONS: 97164- PT Re-evaluation, 97110-Therapeutic exercises, 97530- Therapeutic activity, [id number]- Neuromuscular re-education, 97535- Self Care, 02859- Manual therapy, [id number]- Gait training, [phone]- Orthotic Fit/training, [phone]- Canalith repositioning, [id number]- Aquatic Therapy, [phone]- Splinting, [phone]- Wound care (first 20 sq cm), 97598- Wound care (each additional 20 sq cm)Patient/Family education, Balance training, Stair training, Taping, Dry Needling, Joint mobilization, Joint manipulation, Spinal manipulation, Spinal mobilization, Scar mobilization, and DME instructions.  PLAN FOR NEXT SESSION:  n/a   Prentice GORMAN Stains, PT, DPT 06/14/2024, 2:30 PM

## 2024-06-24 ENCOUNTER — Other Ambulatory Visit (HOSPITAL_COMMUNITY): Payer: Self-pay

## 2024-06-25 ENCOUNTER — Other Ambulatory Visit (HOSPITAL_COMMUNITY): Payer: Self-pay

## 2024-06-27 ENCOUNTER — Other Ambulatory Visit: Payer: Self-pay

## 2024-06-28 ENCOUNTER — Encounter (HOSPITAL_BASED_OUTPATIENT_CLINIC_OR_DEPARTMENT_OTHER): Admitting: Physical Therapy

## 2024-07-22 ENCOUNTER — Other Ambulatory Visit (HOSPITAL_COMMUNITY): Payer: Self-pay

## 2024-07-23 ENCOUNTER — Other Ambulatory Visit: Payer: Self-pay

## 2024-07-23 ENCOUNTER — Other Ambulatory Visit (HOSPITAL_COMMUNITY): Payer: Self-pay

## 2024-08-28 DIAGNOSIS — M4712 Other spondylosis with myelopathy, cervical region: Secondary | ICD-10-CM | POA: Diagnosis not present

## 2024-09-23 ENCOUNTER — Other Ambulatory Visit (HOSPITAL_COMMUNITY): Payer: Self-pay

## 2024-10-18 ENCOUNTER — Other Ambulatory Visit: Payer: Self-pay
